# Patient Record
Sex: Male | Born: 1955
Health system: Southern US, Community
[De-identification: ages and names within clinical notes are randomized; demographics above are authoritative.]

## PROBLEM LIST (undated history)

## (undated) DIAGNOSIS — E785 Hyperlipidemia, unspecified: Secondary | ICD-10-CM

## (undated) DIAGNOSIS — Z8669 Personal history of other diseases of the nervous system and sense organs: Secondary | ICD-10-CM

## (undated) DIAGNOSIS — Z973 Presence of spectacles and contact lenses: Secondary | ICD-10-CM

## (undated) DIAGNOSIS — K219 Gastro-esophageal reflux disease without esophagitis: Secondary | ICD-10-CM

## (undated) DIAGNOSIS — K635 Polyp of colon: Secondary | ICD-10-CM

## (undated) DIAGNOSIS — T7840XA Allergy, unspecified, initial encounter: Secondary | ICD-10-CM

## (undated) DIAGNOSIS — N429 Disorder of prostate, unspecified: Secondary | ICD-10-CM

## (undated) HISTORY — DX: Disorder of prostate, unspecified: N42.9

## (undated) HISTORY — DX: Presence of spectacles and contact lenses: Z97.3

## (undated) HISTORY — DX: Gastro-esophageal reflux disease without esophagitis: K21.9

## (undated) HISTORY — DX: Allergy, unspecified, initial encounter: T78.40XA

## (undated) HISTORY — DX: Personal history of other diseases of the nervous system and sense organs: Z86.69

## (undated) HISTORY — PX: COSMETIC SURGERY: SHX468

## (undated) HISTORY — DX: Polyp of colon: K63.5

## (undated) HISTORY — DX: Hyperlipidemia, unspecified: E78.5

---
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
## 1977-10-12 HISTORY — PX: SCAR REVISION: SHX5285

## 1982-10-12 HISTORY — PX: HERNIA REPAIR: SHX51

## 1993-10-12 HISTORY — PX: OTHER SURGICAL HISTORY: SHX169

## 2011-03-17 ENCOUNTER — Encounter (INDEPENDENT_AMBULATORY_CARE_PROVIDER_SITE_OTHER): Payer: Self-pay | Admitting: General Surgery

## 2013-03-15 ENCOUNTER — Ambulatory Visit: Payer: Self-pay | Admitting: Family Medicine

## 2013-03-27 ENCOUNTER — Encounter: Payer: Self-pay | Admitting: Family Medicine

## 2013-03-27 ENCOUNTER — Ambulatory Visit (INDEPENDENT_AMBULATORY_CARE_PROVIDER_SITE_OTHER): Payer: 59 | Admitting: Family Medicine

## 2013-03-27 VITALS — BP 120/78 | HR 72 | Temp 98.4°F | Resp 12 | Ht 71.5 in | Wt 202.0 lb

## 2013-03-27 DIAGNOSIS — Z87898 Personal history of other specified conditions: Secondary | ICD-10-CM

## 2013-03-27 DIAGNOSIS — K219 Gastro-esophageal reflux disease without esophagitis: Secondary | ICD-10-CM | POA: Insufficient documentation

## 2013-03-27 DIAGNOSIS — Z87442 Personal history of urinary calculi: Secondary | ICD-10-CM

## 2013-03-27 DIAGNOSIS — Z8669 Personal history of other diseases of the nervous system and sense organs: Secondary | ICD-10-CM

## 2013-03-27 DIAGNOSIS — E785 Hyperlipidemia, unspecified: Secondary | ICD-10-CM

## 2013-03-27 MED ORDER — RANITIDINE HCL 150 MG PO TABS: 150.0000 mg | ORAL_TABLET | Freq: Every day | ORAL | Status: DC

## 2013-03-27 NOTE — Progress Notes (Signed)
  Subjective:    Patient ID: Steven Pollard, male    DOB: [birth year], 57 y.o.   MRN: [id number]  HPI Here to establish care Past medical history reviewed and he has history of GERD which is controlled with ranitidine. Prior history of 1 kidney stone 2005. Patient relates head injury 1979 and subsequent seizure. Was taken off Dilantin mid 1990s with no recurrent seizures since then.  History of dyslipidemia treated with Crestor and low vase. No cardiac history. No peripheral vascular disease. Quit smoking several years ago.  Multiple surgeries as outlined elsewhere.  Patient is married. Wife is a Development worker, community. Quit smoking around 2007. Occasional alcohol use. Reported colonoscopy 2009. Tetanus 2012.  Past Medical History  Diagnosis Date  . Hyperlipemia   . GERD (gastroesophageal reflux disease)   . Colon polyps   . Prostate disease   . History of seizure disorder   . Wears glasses    Past Surgical History  Procedure Laterality Date  . Tonsillectomy  1962  . Scar revision  1979  . Perforated eardrum  1995  . Hernia repair  1984    left    reports that he has never smoked. He does not have any smokeless tobacco history on file. His alcohol and drug histories are not on file. family history includes Arthritis in his mother and Heart disease (age of onset: 64) in his father. Allergies  Allergen Reactions  . Latex   . Oxycodone-Acetaminophen   . Caffeine Rash      Review of Systems  Constitutional: Negative for fatigue and unexpected weight change.  HENT: Negative for trouble swallowing.   Eyes: Negative for visual disturbance.  Respiratory: Negative for cough, chest tightness and shortness of breath.   Cardiovascular: Negative for chest pain, palpitations and leg swelling.  Gastrointestinal: Negative for abdominal pain.  Neurological: Negative for dizziness, syncope, weakness, light-headedness and headaches.       Objective:   Physical Exam  Nursing note and vitals  reviewed. Constitutional: He appears well-developed and well-nourished.  HENT:  Mouth/Throat: Oropharynx is clear and moist.  Neck: Neck supple. No thyromegaly present.  Cardiovascular: Normal rate and regular rhythm.   Pulmonary/Chest: Effort normal and breath sounds normal. No respiratory distress. He has no wheezes. He has no rales.  Musculoskeletal: He exhibits no edema.          Assessment & Plan:  #1 dyslipidemia. Over one year since prior labs. Schedule complete physical and lab work then including fasting lipids #2 GERD which is well controlled with low-dose Zantac. Refills were given for Zantac #3 remote history of kidney stone #4 remote history of seizure and he has not had any off medication in approximately 20 years

## 2013-03-27 NOTE — Patient Instructions (Addendum)
Schedule complete physical at your convenience.

## 2013-04-05 ENCOUNTER — Other Ambulatory Visit (INDEPENDENT_AMBULATORY_CARE_PROVIDER_SITE_OTHER): Payer: 59

## 2013-04-05 DIAGNOSIS — Z0000 Encounter for general adult medical examination without abnormal findings: Secondary | ICD-10-CM

## 2013-04-05 LAB — CBC WITH DIFFERENTIAL/PLATELET
Basophils Absolute: 0 10*3/uL (ref 0.0–0.1)
Eosinophils Absolute: 0.2 10*3/uL (ref 0.0–0.7)
HCT: 48 % (ref 39.0–52.0)
Hemoglobin: 16.2 g/dL (ref 13.0–17.0)
Lymphs Abs: 2 10*3/uL (ref 0.7–4.0)
MCHC: 33.8 g/dL (ref 30.0–36.0)
MCV: 97.8 fl (ref 78.0–100.0)
Monocytes Absolute: 0.6 10*3/uL (ref 0.1–1.0)
Neutro Abs: 3 10*3/uL (ref 1.4–7.7)
RDW: 13.3 % (ref 11.5–14.6)

## 2013-04-05 LAB — BASIC METABOLIC PANEL
CO2: 31 mEq/L (ref 19–32)
Glucose, Bld: 110 mg/dL — ABNORMAL HIGH (ref 70–99)
Potassium: 5.3 mEq/L — ABNORMAL HIGH (ref 3.5–5.1)
Sodium: 141 mEq/L (ref 135–145)

## 2013-04-05 LAB — POCT URINALYSIS DIPSTICK
Leukocytes, UA: NEGATIVE
Nitrite, UA: NEGATIVE
Urobilinogen, UA: 0.2

## 2013-04-05 LAB — HEPATIC FUNCTION PANEL
AST: 18 U/L (ref 0–37)
Albumin: 4.2 g/dL (ref 3.5–5.2)

## 2013-04-05 LAB — LIPID PANEL: Cholesterol: 151 mg/dL (ref 0–200)

## 2013-04-05 LAB — TSH: TSH: 1.48 u[IU]/mL (ref 0.35–5.50)

## 2013-04-12 ENCOUNTER — Ambulatory Visit (INDEPENDENT_AMBULATORY_CARE_PROVIDER_SITE_OTHER): Payer: 59 | Admitting: Family Medicine

## 2013-04-12 ENCOUNTER — Encounter: Payer: Self-pay | Admitting: Family Medicine

## 2013-04-12 VITALS — BP 126/64 | HR 78 | Temp 98.2°F | Ht 72.0 in | Wt 203.0 lb

## 2013-04-12 DIAGNOSIS — Z0000 Encounter for general adult medical examination without abnormal findings: Secondary | ICD-10-CM

## 2013-04-12 MED ORDER — OMEGA-3-ACID ETHYL ESTERS 1 G PO CAPS: ORAL_CAPSULE | ORAL | Status: DC

## 2013-04-12 MED ORDER — ROSUVASTATIN CALCIUM 10 MG PO TABS: 10.0000 mg | ORAL_TABLET | Freq: Every day | ORAL | Status: DC

## 2013-04-12 NOTE — Progress Notes (Signed)
  Subjective:    Patient ID: Steven Pollard, male    DOB: 1956-08-05, 57 y.o.   MRN: [id number]  HPI Patient seen for complete physical. Past history of GERD, remote history of kidney stones, remote history of seizure, and dyslipidemia Patient takes aspirin 1 daily Crestor 10 mg daily, and over-the-counter Zantac as needed. He takes omega-3 supplement 4 g daily  No history of peripheral vascular disease or coronary disease. Father had coronary disease age 57.  Patient nonsmoker. Some mild recent weight gain. No consistent exercise. Colonoscopy estimated 3-4 years ago. Tetanus and other immunizations up to date.  Past Medical History  Diagnosis Date  . Hyperlipemia   . GERD (gastroesophageal reflux disease)   . Colon polyps   . Prostate disease   . History of seizure disorder   . Wears glasses    Past Surgical History  Procedure Laterality Date  . Tonsillectomy  1962  . Scar revision  1979  . Perforated eardrum  1995  . Hernia repair  1984    left    reports that he has never smoked. He does not have any smokeless tobacco history on file. His alcohol and drug histories are not on file. family history includes Arthritis in his mother and Heart disease (age of onset: 67) in his father. Allergies  Allergen Reactions  . Latex   . Oxycodone-Acetaminophen   . Caffeine Rash      Review of Systems  Constitutional: Negative for fever, activity change, appetite change, fatigue and unexpected weight change.  HENT: Negative for ear pain, congestion and trouble swallowing.   Eyes: Negative for pain and visual disturbance.  Respiratory: Negative for cough, shortness of breath and wheezing.   Cardiovascular: Negative for chest pain and palpitations.  Gastrointestinal: Negative for nausea, vomiting, abdominal pain, diarrhea, constipation, blood in stool, abdominal distention and rectal pain.  Genitourinary: Negative for dysuria, hematuria and testicular pain.  Musculoskeletal:  Negative for joint swelling and arthralgias.  Skin: Negative for rash.  Neurological: Negative for dizziness, syncope and headaches.  Hematological: Negative for adenopathy.  Psychiatric/Behavioral: Negative for confusion and dysphoric mood.       Objective:   Physical Exam  Constitutional: He is oriented to person, place, and time. He appears well-developed and well-nourished. No distress.  HENT:  Head: Normocephalic and atraumatic.  Left Ear: External ear normal.  Mouth/Throat: Oropharynx is clear and moist.  Right eardrum is dysmorphic from multiple prior surgeries  Eyes: Conjunctivae and EOM are normal. Pupils are equal, round, and reactive to light.  Neck: Normal range of motion. Neck supple. No thyromegaly present.  Cardiovascular: Normal rate, regular rhythm and normal heart sounds.   No murmur heard. Pulmonary/Chest: No respiratory distress. He has no wheezes. He has no rales.  Abdominal: Soft. Bowel sounds are normal. He exhibits no distension and no mass. There is no tenderness. There is no rebound and no guarding.  Genitourinary: Rectum normal and prostate normal.  Musculoskeletal: He exhibits no edema.  Lymphadenopathy:    He has no cervical adenopathy.  Neurological: He is alert and oriented to person, place, and time. He displays normal reflexes. No cranial nerve deficit.  Skin: No rash noted.  Psychiatric: He has a normal mood and affect.          Assessment & Plan:  Complete physical. Consider shingles vaccine next year. Continue yearly flu vaccine. Refill lovaza and Crestor for one year. He has prediabetes. Reduce white starches and high glycemic foods. Establish more consistent exercise.

## 2013-04-12 NOTE — Patient Instructions (Addendum)

## 2013-04-28 ENCOUNTER — Other Ambulatory Visit: Payer: Self-pay | Admitting: Family Medicine

## 2013-11-23 ENCOUNTER — Encounter: Payer: Self-pay | Admitting: Family Medicine

## 2013-11-23 ENCOUNTER — Ambulatory Visit (INDEPENDENT_AMBULATORY_CARE_PROVIDER_SITE_OTHER): Payer: 59 | Admitting: Family Medicine

## 2013-11-23 VITALS — BP 130/78 | HR 72 | Temp 98.2°F | Wt 202.0 lb

## 2013-11-23 DIAGNOSIS — M436 Torticollis: Secondary | ICD-10-CM

## 2013-11-23 MED ORDER — CYCLOBENZAPRINE HCL 10 MG PO TABS: 10.0000 mg | ORAL_TABLET | Freq: Three times a day (TID) | ORAL | Status: DC | PRN

## 2013-11-23 NOTE — Progress Notes (Signed)
Pre visit review using our clinic review tool, if applicable. No additional management support is needed unless otherwise documented below in the visit note. 

## 2013-11-23 NOTE — Progress Notes (Signed)
   Subjective:    Patient ID: Steven Pollard, male    DOB: 25-Dec-1955, 58 y.o.   MRN: [id number]  HPI Patient seen with possible torticollis. About one month ago he was in a crawl space working on a house and was in same position for a long period. He has had one month of symptoms of right-sided neck tightness. Symptoms are worse first thing in the morning and gradually improved. Not tried any heat. He's tried occasional Tylenol. He denies radiculopathy symptoms. Denies any fever or sore throat or any other infectious symptoms. No recent trauma.  Past Medical History  Diagnosis Date  . Hyperlipemia   . GERD (gastroesophageal reflux disease)   . Colon polyps   . Prostate disease   . History of seizure disorder   . Wears glasses    Past Surgical History  Procedure Laterality Date  . Tonsillectomy  1962  . Scar revision  1979  . Perforated eardrum  1995  . Hernia repair  1984    left    reports that he has never smoked. He does not have any smokeless tobacco history on file. His alcohol and drug histories are not on file. family history includes Arthritis in his mother; Heart disease (age of onset: 58) in his father. Allergies  Allergen Reactions  . Latex   . Oxycodone-Acetaminophen   . Caffeine Rash      Review of Systems  Constitutional: Negative for fever and chills.  HENT: Negative for sore throat.   Neurological: Negative for weakness and numbness.       Objective:   Physical Exam  Constitutional: He appears well-developed and well-nourished.  HENT:  Mouth/Throat: Oropharynx is clear and moist.  Neck: Neck supple.  Cardiovascular: Normal rate and regular rhythm.   Pulmonary/Chest: Effort normal and breath sounds normal. No respiratory distress. He has no wheezes. He has no rales.  Musculoskeletal:  Fairly good range of motion cervical spine. No spinal tenderness.  Lymphadenopathy:    He has no cervical adenopathy.          Assessment & Plan:  Mild  torticollis. No infectious origin. Try Flexeril 10 mg each bedtime. Try moist heat. Try over-the-counter anti-inflammatories. Consider physical therapy if no better one to 2 weeks

## 2013-11-23 NOTE — Patient Instructions (Signed)
Torticollis, Acute °You have suddenly (acutely) developed a twisted neck (torticollis). This is usually a self-limited condition. °CAUSES  °Acute torticollis may be caused by malposition, trauma or infection. Most commonly, acute torticollis is caused by sleeping in an awkward position. Torticollis may also be caused by the flexion, extension or twisting of the neck muscles beyond their normal position. Sometimes, the exact cause may not be known. °SYMPTOMS  °Usually, there is pain and limited movement of the neck. Your neck may twist to one side. °DIAGNOSIS  °The diagnosis is often made by physical examination. X-rays, CT scans or MRIs may be done if there is a history of trauma or concern of infection. °TREATMENT  °For a common, stiff neck that develops during sleep, treatment is focused on relaxing the contracted neck muscle. Medications (including shots) may be used to treat the problem. Most cases resolve in several days. Torticollis usually responds to conservative physical therapy. If left untreated, the shortened and spastic neck muscle can cause deformities in the face and neck. Rarely, surgery is required. °HOME CARE INSTRUCTIONS  °· Use over-the-counter and prescription medications as directed by your caregiver. °· Do stretching exercises and massage the neck as directed by your caregiver. °· Follow up with physical therapy if needed and as directed by your caregiver. °SEEK IMMEDIATE MEDICAL CARE IF:  °· You develop difficulty breathing or noisy breathing (stridor). °· You drool, develop trouble swallowing or have pain with swallowing. °· You develop numbness or weakness in the hands or feet. °· You have changes in speech or vision. °· You have problems with urination or bowel movements. °· You have difficulty walking. °· You have a fever. °· You have increased pain. °MAKE SURE YOU:  °· Understand these instructions. °· Will watch your condition. °· Will get help right away if you are not doing well or  get worse. °Document Released: 09/25/2000 Document Revised: 12/21/2011 Document Reviewed: 11/06/2009 °[employer]® Patient Information ©2014 [employer]. ° °

## 2014-04-10 ENCOUNTER — Other Ambulatory Visit: Payer: Self-pay | Admitting: Family Medicine

## 2014-09-10 ENCOUNTER — Ambulatory Visit (INDEPENDENT_AMBULATORY_CARE_PROVIDER_SITE_OTHER): Payer: 59 | Admitting: Family Medicine

## 2014-09-10 ENCOUNTER — Encounter: Payer: Self-pay | Admitting: Family Medicine

## 2014-09-10 VITALS — BP 120/80 | Temp 97.9°F | Wt 196.0 lb

## 2014-09-10 DIAGNOSIS — H66004 Acute suppurative otitis media without spontaneous rupture of ear drum, recurrent, right ear: Secondary | ICD-10-CM

## 2014-09-10 MED ORDER — AMOXICILLIN-POT CLAVULANATE 875-125 MG PO TABS: 1.0000 | ORAL_TABLET | Freq: Two times a day (BID) | ORAL | Status: DC

## 2014-09-10 NOTE — Progress Notes (Signed)
Pre visit review using our clinic review tool, if applicable. No additional management support is needed unless otherwise documented below in the visit note. 

## 2014-09-10 NOTE — Progress Notes (Signed)
  Garret Reddish, MD Phone: [phone]  Subjective:   Steven Pollard is a 58 y.o. year old very pleasant male patient who presents with the following:  R ear pain Head injury in 1979 with perforated ear drum at that time. Patch by Dr. Truman Hayward but needed multiple surgeries with intermittent infections. Started seeing Dr. Thornell Mule due to recurrence of perforation and has another graft with at least 2 surgeries.   Patient can usually tell when it is infected. Took amoxicillin x 3 days and used ciprodex drops for another 3 days. Been going on for 3-4 weeks, progressing when not on antibiotics. Not draining currently  (ear did have some purulent drainage previously) but current moderate dull ache at 4/10  ROS- no fevers/chills/nausea/vomiting. No headaches or blurry vision. Did have vertigo about 3 weeks ago with 1 episode.   Past Medical History- Patient Active Problem List   Diagnosis Date Noted  . GERD (gastroesophageal reflux disease) 03/27/2013  . History of kidney stones 03/27/2013  . History of seizure 03/27/2013  . Dyslipidemia 03/27/2013   Medications- reviewed and updated Current Outpatient Prescriptions  Medication Sig Dispense Refill  . aspirin 81 MG tablet Take 81 mg by mouth daily.      . cyclobenzaprine (FLEXERIL) 10 MG tablet Take 1 tablet (10 mg total) by mouth 3 (three) times daily as needed for muscle spasms. 30 tablet 1  . omega-3 acid ethyl esters (LOVAZA) 1 G capsule Take 4 capsules daily 360 capsule 3  . ranitidine (ZANTAC) 150 MG tablet TAKE 1 TABLET BY MOUTH DAILY. 90 tablet 2  . rosuvastatin (CRESTOR) 10 MG tablet Take 1 tablet (10 mg total) by mouth daily. 90 tablet 3   Objective: BP 120/80 mmHg  Temp(Src) 97.9 F (36.6 C) (Oral)  Wt 196 lb (88.905 kg) Gen: NAD, resting comfortably TM normal on left TM on right-purulent material behind an erythematous bulging TM membrane with tinge of blood on upper right CV: RRR no murmurs rubs or gallops Lungs: CTAB no  crackles, wheeze, rhonchi Skin: warm, dry, no rash   Assessment/Plan:  Acute Otitis Media Right Treat with Augmentin x 10 days. Usually sees ENT for this but could not see Dr. Thornell Mule until Dec 15th. States usually uses ear drops such as ciprodex as well. I do not see an obvious perforation or drainage of purulent material so we will start with Augmentin alone and consider ciprodex if symptoms persist. Has follow up with Dr.Burchette in mid December which will be reasonable time for recheck. Patient could also consider ENT follow up with complicated history and multiple perforations and surgical repair.   Return precautions advised.   Meds ordered this encounter  Medications  . amoxicillin-clavulanate (AUGMENTIN) 875-125 MG per tablet    Sig: Take 1 tablet by mouth 2 (two) times daily.    Dispense:  20 tablet    Refill:  0

## 2014-09-10 NOTE — Patient Instructions (Signed)
Otitis media  Treat with augmentin x 10 days  Follow up if symptoms worsen or fail to improve  Health Maintenance Due  Topic Date Due  . TETANUS/TDAP  12/12/1972  . COLONOSCOPY  12/13/2003  . ZOSTAVAX  12/12/2013  . INFLUENZA VACCINE  05/12/2014

## 2014-09-17 ENCOUNTER — Encounter: Payer: Self-pay | Admitting: Family Medicine

## 2014-09-17 MED ORDER — CIPROFLOXACIN-DEXAMETHASONE 0.3-0.1 % OT SUSP: 4.0000 [drp] | Freq: Two times a day (BID) | OTIC | Status: DC

## 2014-09-19 ENCOUNTER — Other Ambulatory Visit (INDEPENDENT_AMBULATORY_CARE_PROVIDER_SITE_OTHER): Payer: 59

## 2014-09-19 DIAGNOSIS — Z0000 Encounter for general adult medical examination without abnormal findings: Secondary | ICD-10-CM

## 2014-09-19 LAB — HEPATIC FUNCTION PANEL
ALK PHOS: 57 U/L (ref 39–117)
ALT: 23 U/L (ref 0–53)
AST: 23 U/L (ref 0–37)
Albumin: 4.2 g/dL (ref 3.5–5.2)
BILIRUBIN DIRECT: 0.2 mg/dL (ref 0.0–0.3)
Total Bilirubin: 1.2 mg/dL (ref 0.2–1.2)
Total Protein: 6.4 g/dL (ref 6.0–8.3)

## 2014-09-19 LAB — CBC WITH DIFFERENTIAL/PLATELET
BASOS ABS: 0 10*3/uL (ref 0.0–0.1)
BASOS PCT: 0.6 % (ref 0.0–3.0)
Eosinophils Absolute: 0.2 10*3/uL (ref 0.0–0.7)
Eosinophils Relative: 5 % (ref 0.0–5.0)
HCT: 46.4 % (ref 39.0–52.0)
Hemoglobin: 15.6 g/dL (ref 13.0–17.0)
LYMPHS PCT: 33.9 % (ref 12.0–46.0)
Lymphs Abs: 1.7 10*3/uL (ref 0.7–4.0)
MCHC: 33.5 g/dL (ref 30.0–36.0)
MCV: 95.9 fl (ref 78.0–100.0)
Monocytes Absolute: 0.4 10*3/uL (ref 0.1–1.0)
Monocytes Relative: 7.7 % (ref 3.0–12.0)
NEUTROS PCT: 52.8 % (ref 43.0–77.0)
Neutro Abs: 2.6 10*3/uL (ref 1.4–7.7)
Platelets: 168 10*3/uL (ref 150.0–400.0)
RBC: 4.84 Mil/uL (ref 4.22–5.81)
RDW: 13.4 % (ref 11.5–15.5)
WBC: 5 10*3/uL (ref 4.0–10.5)

## 2014-09-19 LAB — TSH: TSH: 1.35 u[IU]/mL (ref 0.35–4.50)

## 2014-09-19 LAB — LIPID PANEL
Cholesterol: 123 mg/dL (ref 0–200)
HDL: 33 mg/dL — ABNORMAL LOW (ref >39.00)
LDL Cholesterol: 78 mg/dL (ref 0–99)
NONHDL: 90
TRIGLYCERIDES: 58 mg/dL (ref 0.0–149.0)
Total CHOL/HDL Ratio: 4
VLDL: 11.6 mg/dL (ref 0.0–40.0)

## 2014-09-19 LAB — POCT URINALYSIS DIPSTICK
Blood, UA: NEGATIVE
GLUCOSE UA: NEGATIVE
KETONES UA: NEGATIVE
LEUKOCYTES UA: NEGATIVE
Nitrite, UA: NEGATIVE
PH UA: 5.5
Protein, UA: NEGATIVE
Spec Grav, UA: 1.015
Urobilinogen, UA: 0.2

## 2014-09-19 LAB — BASIC METABOLIC PANEL
BUN: 10 mg/dL (ref 6–23)
CALCIUM: 9.2 mg/dL (ref 8.4–10.5)
CHLORIDE: 102 meq/L (ref 96–112)
CO2: 30 meq/L (ref 19–32)
Creatinine, Ser: 0.9 mg/dL (ref 0.4–1.5)
GFR: 96.16 mL/min (ref >60.00)
Glucose, Bld: 95 mg/dL (ref 70–99)
Potassium: 4.4 mEq/L (ref 3.5–5.1)
Sodium: 139 mEq/L (ref 135–145)

## 2014-09-19 LAB — PSA: PSA: 0.96 ng/mL (ref 0.10–4.00)

## 2014-09-26 ENCOUNTER — Ambulatory Visit (INDEPENDENT_AMBULATORY_CARE_PROVIDER_SITE_OTHER): Payer: 59 | Admitting: Family Medicine

## 2014-09-26 ENCOUNTER — Encounter: Payer: Self-pay | Admitting: Family Medicine

## 2014-09-26 ENCOUNTER — Ambulatory Visit (INDEPENDENT_AMBULATORY_CARE_PROVIDER_SITE_OTHER): Payer: 59

## 2014-09-26 VITALS — BP 120/78 | HR 68 | Temp 97.6°F | Ht 71.0 in | Wt 193.0 lb

## 2014-09-26 DIAGNOSIS — Z0000 Encounter for general adult medical examination without abnormal findings: Secondary | ICD-10-CM

## 2014-09-26 DIAGNOSIS — Z23 Encounter for immunization: Secondary | ICD-10-CM

## 2014-09-26 MED ORDER — ROSUVASTATIN CALCIUM 10 MG PO TABS: 10.0000 mg | ORAL_TABLET | Freq: Every day | ORAL | Status: DC

## 2014-09-26 MED ORDER — RANITIDINE HCL 150 MG PO TABS: 150.0000 mg | ORAL_TABLET | Freq: Every day | ORAL | Status: DC

## 2014-09-26 MED ORDER — OMEGA-3-ACID ETHYL ESTERS 1 G PO CAPS: ORAL_CAPSULE | ORAL | Status: DC

## 2014-09-26 NOTE — Progress Notes (Signed)
   Subjective:    Patient ID: Steven Pollard, male    DOB: 20-Oct-1955, 58 y.o.   MRN: [id number]  HPI Patient seen for complete physical. He has history of hyperlipidemia and GERD. Recent suppurative right otitis which did clear with oral antibiotic and topical drops. No drainage at this time. Needs flu vaccine. No history of shingles vaccine undecided. Colonoscopy 2009 reportedly. No consistent exercise.  Past Medical History  Diagnosis Date  . Hyperlipemia   . GERD (gastroesophageal reflux disease)   . Colon polyps   . Prostate disease   . History of seizure disorder   . Wears glasses    Past Surgical History  Procedure Laterality Date  . Tonsillectomy  1962  . Scar revision  1979  . Perforated eardrum  1995  . Hernia repair  1984    left    reports that he has never smoked. He does not have any smokeless tobacco history on file. His alcohol and drug histories are not on file. family history includes Arthritis in his mother; Heart disease (age of onset: 70) in his father. Allergies  Allergen Reactions  . Latex   . Oxycodone-Acetaminophen   . Caffeine Rash      Review of Systems  Constitutional: Negative for fever, activity change, appetite change and fatigue.  HENT: Negative for congestion, ear pain and trouble swallowing.   Eyes: Negative for pain and visual disturbance.  Respiratory: Negative for cough, shortness of breath and wheezing.   Cardiovascular: Negative for chest pain and palpitations.  Gastrointestinal: Negative for nausea, vomiting, abdominal pain, diarrhea, constipation, blood in stool, abdominal distention and rectal pain.  Genitourinary: Negative for dysuria, hematuria and testicular pain.  Musculoskeletal: Negative for joint swelling and arthralgias.  Skin: Negative for rash.  Neurological: Negative for dizziness, syncope and headaches.  Hematological: Negative for adenopathy.  Psychiatric/Behavioral: Negative for confusion and dysphoric mood.      Objective:   Physical Exam  Constitutional: He is oriented to person, place, and time. He appears well-developed and well-nourished. No distress.  HENT:  Head: Normocephalic and atraumatic.  Left Ear: External ear normal.  Mouth/Throat: Oropharynx is clear and moist.  Distorted landmarks right eardrum. He has some chronic supperative changes  Eyes: Conjunctivae and EOM are normal. Pupils are equal, round, and reactive to light.  Neck: Normal range of motion. Neck supple. No thyromegaly present.  Cardiovascular: Normal rate, regular rhythm and normal heart sounds.   No murmur heard. Pulmonary/Chest: No respiratory distress. He has no wheezes. He has no rales.  Abdominal: Soft. Bowel sounds are normal. He exhibits no distension and no mass. There is no tenderness. There is no rebound and no guarding.  Musculoskeletal: He exhibits no edema.  Lymphadenopathy:    He has no cervical adenopathy.  Neurological: He is alert and oriented to person, place, and time. He displays normal reflexes. No cranial nerve deficit.  Skin: No rash noted.  Psychiatric: He has a normal mood and affect.          Assessment & Plan:  Complete physical. Labs reviewed with no major concerns. Flu vaccine given. Consider shingles vaccine. Confirm date of last colonoscopy and date of last tetanus.

## 2014-09-26 NOTE — Patient Instructions (Signed)
Let us know if you are interested in getting the shingles vaccine

## 2014-09-26 NOTE — Progress Notes (Signed)
Pre visit review using our clinic review tool, if applicable. No additional management support is needed unless otherwise documented below in the visit note. 

## 2015-09-20 ENCOUNTER — Other Ambulatory Visit: Payer: Self-pay | Admitting: Family Medicine

## 2015-09-25 ENCOUNTER — Other Ambulatory Visit (INDEPENDENT_AMBULATORY_CARE_PROVIDER_SITE_OTHER): Payer: Commercial Managed Care - HMO

## 2015-09-25 DIAGNOSIS — Z0000 Encounter for general adult medical examination without abnormal findings: Secondary | ICD-10-CM | POA: Diagnosis not present

## 2015-09-25 LAB — BASIC METABOLIC PANEL
BUN: 10 mg/dL (ref 6–23)
CALCIUM: 9.8 mg/dL (ref 8.4–10.5)
CO2: 32 meq/L (ref 19–32)
CREATININE: 0.96 mg/dL (ref 0.40–1.50)
Chloride: 101 mEq/L (ref 96–112)
GFR: 84.42 mL/min (ref >60.00)
Glucose, Bld: 107 mg/dL — ABNORMAL HIGH (ref 70–99)
Potassium: 4 mEq/L (ref 3.5–5.1)
Sodium: 141 mEq/L (ref 135–145)

## 2015-09-25 LAB — HEPATIC FUNCTION PANEL
ALK PHOS: 63 U/L (ref 39–117)
ALT: 22 U/L (ref 0–53)
AST: 18 U/L (ref 0–37)
Albumin: 4.7 g/dL (ref 3.5–5.2)
BILIRUBIN DIRECT: 0.3 mg/dL (ref 0.0–0.3)
BILIRUBIN TOTAL: 1.1 mg/dL (ref 0.2–1.2)
TOTAL PROTEIN: 6.9 g/dL (ref 6.0–8.3)

## 2015-09-25 LAB — CBC WITH DIFFERENTIAL/PLATELET
BASOS PCT: 0.6 % (ref 0.0–3.0)
Basophils Absolute: 0 10*3/uL (ref 0.0–0.1)
EOS PCT: 2.5 % (ref 0.0–5.0)
Eosinophils Absolute: 0.1 10*3/uL (ref 0.0–0.7)
HEMATOCRIT: 47.6 % (ref 39.0–52.0)
HEMOGLOBIN: 16.1 g/dL (ref 13.0–17.0)
LYMPHS PCT: 31.7 % (ref 12.0–46.0)
Lymphs Abs: 1.8 10*3/uL (ref 0.7–4.0)
MCHC: 33.9 g/dL (ref 30.0–36.0)
MCV: 95.5 fl (ref 78.0–100.0)
MONO ABS: 0.6 10*3/uL (ref 0.1–1.0)
Monocytes Relative: 10.2 % (ref 3.0–12.0)
Neutro Abs: 3.2 10*3/uL (ref 1.4–7.7)
Neutrophils Relative %: 55 % (ref 43.0–77.0)
Platelets: 180 10*3/uL (ref 150.0–400.0)
RBC: 4.99 Mil/uL (ref 4.22–5.81)
RDW: 13.3 % (ref 11.5–15.5)
WBC: 5.8 10*3/uL (ref 4.0–10.5)

## 2015-09-25 LAB — LIPID PANEL
CHOL/HDL RATIO: 3
Cholesterol: 131 mg/dL (ref 0–200)
HDL: 39.9 mg/dL (ref >39.00)
LDL CALC: 74 mg/dL (ref 0–99)
NONHDL: 91.2
Triglycerides: 84 mg/dL (ref 0.0–149.0)
VLDL: 16.8 mg/dL (ref 0.0–40.0)

## 2015-09-25 LAB — TSH: TSH: 1.53 u[IU]/mL (ref 0.35–4.50)

## 2015-09-25 LAB — PSA: PSA: 1.52 ng/mL (ref 0.10–4.00)

## 2015-09-30 ENCOUNTER — Encounter: Payer: Self-pay | Admitting: Family Medicine

## 2015-10-02 ENCOUNTER — Encounter: Payer: Self-pay | Admitting: Family Medicine

## 2015-10-02 ENCOUNTER — Ambulatory Visit (INDEPENDENT_AMBULATORY_CARE_PROVIDER_SITE_OTHER): Payer: Commercial Managed Care - HMO | Admitting: Family Medicine

## 2015-10-02 VITALS — BP 118/82 | HR 95 | Temp 97.5°F | Resp 14 | Ht 71.0 in | Wt 197.1 lb

## 2015-10-02 DIAGNOSIS — Z0000 Encounter for general adult medical examination without abnormal findings: Secondary | ICD-10-CM

## 2015-10-02 DIAGNOSIS — Z23 Encounter for immunization: Secondary | ICD-10-CM | POA: Diagnosis not present

## 2015-10-02 DIAGNOSIS — R972 Elevated prostate specific antigen [PSA]: Secondary | ICD-10-CM

## 2015-10-02 DIAGNOSIS — R739 Hyperglycemia, unspecified: Secondary | ICD-10-CM | POA: Diagnosis not present

## 2015-10-02 NOTE — Progress Notes (Signed)
Pre visit review using our clinic review tool, if applicable. No additional management support is needed unless otherwise documented below in the visit note. 

## 2015-10-02 NOTE — Progress Notes (Signed)
Subjective:    Patient ID: Steven Pollard, male    DOB: 09-Nov-1955, 59 y.o.   MRN: [id number]  HPI Patient here for complete physical History of dyslipidemia treated with Crestor and Lovaza Also takes baby aspirin one daily.  He has past history of kidney stones and remote history of seizure. None recently. Never smoked. Has been retired for several years. No consistent exercise. No history of shingles vaccine. Still needs flu vaccine. He is fairly certain he had colonoscopy about 5 years ago and he will confirm date  Past Medical History  Diagnosis Date  . Hyperlipemia   . GERD (gastroesophageal reflux disease)   . Colon polyps   . Prostate disease   . History of seizure disorder   . Wears glasses    Past Surgical History  Procedure Laterality Date  . Tonsillectomy  1962  . Scar revision  1979  . Perforated eardrum  1995  . Hernia repair  1984    left    reports that he has never smoked. He does not have any smokeless tobacco history on file. His alcohol and drug histories are not on file. family history includes Arthritis in his mother; Heart disease (age of onset: 8) in his father. Allergies  Allergen Reactions  . Latex   . Oxycodone-Acetaminophen   . Caffeine Rash       Review of Systems  Constitutional: Negative for fever, chills, activity change, appetite change, fatigue and unexpected weight change.  HENT: Negative for congestion, ear pain and trouble swallowing.   Eyes: Negative for pain and visual disturbance.  Respiratory: Negative for cough, shortness of breath and wheezing.   Cardiovascular: Negative for chest pain and palpitations.  Gastrointestinal: Negative for nausea, vomiting, abdominal pain, diarrhea, constipation, blood in stool, abdominal distention and rectal pain.  Endocrine: Negative for polydipsia and polyuria.  Genitourinary: Negative for dysuria, hematuria and testicular pain.  Musculoskeletal: Negative for joint swelling and  arthralgias.  Skin: Negative for rash.  Neurological: Negative for dizziness, syncope and headaches.  Hematological: Negative for adenopathy.  Psychiatric/Behavioral: Negative for confusion and dysphoric mood.       Objective:   Physical Exam  Constitutional: He is oriented to person, place, and time. He appears well-developed and well-nourished. No distress.  HENT:  Head: Normocephalic and atraumatic.  Left Ear: External ear normal.  Mouth/Throat: Oropharynx is clear and moist.  He has chronic scarring and distortion right eardrum with serous effusion  Eyes: Conjunctivae and EOM are normal. Pupils are equal, round, and reactive to light.  Neck: Normal range of motion. Neck supple. No thyromegaly present.  Cardiovascular: Normal rate, regular rhythm and normal heart sounds.   No murmur heard. Pulmonary/Chest: No respiratory distress. He has no wheezes. He has no rales.  Abdominal: Soft. Bowel sounds are normal. He exhibits no distension and no mass. There is no tenderness. There is no rebound and no guarding.  Musculoskeletal: He exhibits no edema.  Lymphadenopathy:    He has no cervical adenopathy.  Neurological: He is alert and oriented to person, place, and time. He displays normal reflexes. No cranial nerve deficit.  Skin: No rash noted.  Psychiatric: He has a normal mood and affect.          Assessment & Plan:  Physical exam. Labs reviewed. PSA increased from 0.96-1.52. History of increased PSA velocity in the past and has seen urologist in the past. He agrees with repeat PSA in 6 months. Blood sugar mildly elevated today. Check A1c  then. Establish more consistent exercise. Flu vaccine and shingles vaccine given after discussion of risk and benefits.

## 2016-04-01 ENCOUNTER — Other Ambulatory Visit: Payer: Commercial Managed Care - HMO

## 2016-05-13 ENCOUNTER — Encounter: Payer: Self-pay | Admitting: Family Medicine

## 2016-05-13 ENCOUNTER — Ambulatory Visit (INDEPENDENT_AMBULATORY_CARE_PROVIDER_SITE_OTHER): Payer: Commercial Managed Care - HMO | Admitting: Family Medicine

## 2016-05-13 VITALS — BP 120/86 | HR 78 | Temp 97.8°F | Ht 71.0 in | Wt 201.0 lb

## 2016-05-13 DIAGNOSIS — Z129 Encounter for screening for malignant neoplasm, site unspecified: Secondary | ICD-10-CM

## 2016-05-13 DIAGNOSIS — R630 Anorexia: Secondary | ICD-10-CM | POA: Diagnosis not present

## 2016-05-13 DIAGNOSIS — H66001 Acute suppurative otitis media without spontaneous rupture of ear drum, right ear: Secondary | ICD-10-CM

## 2016-05-13 DIAGNOSIS — R972 Elevated prostate specific antigen [PSA]: Secondary | ICD-10-CM

## 2016-05-13 DIAGNOSIS — R739 Hyperglycemia, unspecified: Secondary | ICD-10-CM | POA: Diagnosis not present

## 2016-05-13 DIAGNOSIS — F411 Generalized anxiety disorder: Secondary | ICD-10-CM

## 2016-05-13 DIAGNOSIS — F5102 Adjustment insomnia: Secondary | ICD-10-CM

## 2016-05-13 LAB — CBC WITH DIFFERENTIAL/PLATELET
BASOS PCT: 0.3 % (ref 0.0–3.0)
Basophils Absolute: 0 10*3/uL (ref 0.0–0.1)
EOS PCT: 3.9 % (ref 0.0–5.0)
Eosinophils Absolute: 0.3 10*3/uL (ref 0.0–0.7)
HCT: 45.4 % (ref 39.0–52.0)
Hemoglobin: 15.6 g/dL (ref 13.0–17.0)
LYMPHS ABS: 2.1 10*3/uL (ref 0.7–4.0)
Lymphocytes Relative: 30.1 % (ref 12.0–46.0)
MCHC: 34.3 g/dL (ref 30.0–36.0)
MCV: 95.3 fl (ref 78.0–100.0)
MONOS PCT: 10.7 % (ref 3.0–12.0)
Monocytes Absolute: 0.7 10*3/uL (ref 0.1–1.0)
NEUTROS ABS: 3.8 10*3/uL (ref 1.4–7.7)
NEUTROS PCT: 55 % (ref 43.0–77.0)
PLATELETS: 185 10*3/uL (ref 150.0–400.0)
RBC: 4.76 Mil/uL (ref 4.22–5.81)
RDW: 13.6 % (ref 11.5–15.5)
WBC: 6.9 10*3/uL (ref 4.0–10.5)

## 2016-05-13 LAB — HEMOGLOBIN A1C: Hgb A1c MFr Bld: 5.5 % (ref 4.6–6.5)

## 2016-05-13 LAB — COMPREHENSIVE METABOLIC PANEL
ALT: 21 U/L (ref 0–53)
AST: 16 U/L (ref 0–37)
Albumin: 4.7 g/dL (ref 3.5–5.2)
Alkaline Phosphatase: 63 U/L (ref 39–117)
BUN: 12 mg/dL (ref 6–23)
CALCIUM: 9.9 mg/dL (ref 8.4–10.5)
CHLORIDE: 103 meq/L (ref 96–112)
CO2: 31 meq/L (ref 19–32)
Creatinine, Ser: 1.08 mg/dL (ref 0.40–1.50)
GFR: 73.54 mL/min (ref >60.00)
Glucose, Bld: 100 mg/dL — ABNORMAL HIGH (ref 70–99)
Potassium: 4.8 mEq/L (ref 3.5–5.1)
Sodium: 141 mEq/L (ref 135–145)
Total Bilirubin: 1.5 mg/dL — ABNORMAL HIGH (ref 0.2–1.2)
Total Protein: 6.8 g/dL (ref 6.0–8.3)

## 2016-05-13 LAB — PSA: PSA: 3.46 ng/mL (ref 0.10–4.00)

## 2016-05-13 LAB — SEDIMENTATION RATE: Sed Rate: 2 mm/hr (ref 0–20)

## 2016-05-13 MED ORDER — AMOXICILLIN-POT CLAVULANATE 875-125 MG PO TABS: 1.0000 | ORAL_TABLET | Freq: Two times a day (BID) | ORAL | 0 refills | Status: DC

## 2016-05-13 NOTE — Progress Notes (Signed)
Subjective:     Patient ID: Steven Pollard, male   DOB: 1956/06/02, 60 y.o.   MRN: [id number]  HPI Patient is seen accompanied by his wife with several issues as follows  Last week, his wife was out of town and he became quite anxious. He's had significant insomnia for the past few nights. States he had a similar episode of anxiety and some depression that came on rather acutely back in 2005. No history of mania or bipolar. Mood has been somewhat down over the past week. Denies any specific stressors other than his wife being out of town. He did relate a recent dream where his mother (who passed away several years ago) was in his presence and he thinks that might have triggered some anxiety and depression symptoms.  He also has noted decreased appetite over the past week. His weight is actually up 4 pounds compared to physical back in December. His wife states that he has fear of cancer but does not tend to obsess about this chronically. He did have somewhat increased PSA velocity back in December and we had discussed possible 6 month repeat. Glucose 107 with no history of diabetes. We also discussed repeat A1c. He has had some polydipsia but no polyuria.  Patient also relates some right ear pain of one-day duration. Minimal drainage. No acute hearing loss. No headaches. No fevers or chills.  Patient quit smoking 10 years ago. Over 30 pack year history. He's had occasional dry cough but no hemoptysis. No documented weight loss. No dyspnea He is interested in low dose CT lung cancer screen.  Past Medical History:  Diagnosis Date  . Colon polyps   . GERD (gastroesophageal reflux disease)   . History of seizure disorder   . Hyperlipemia   . Prostate disease   . Wears glasses    Past Surgical History:  Procedure Laterality Date  . HERNIA REPAIR  1984   left  . perforated eardrum  1995  . [place]  . TONSILLECTOMY  1962    reports that he has never smoked. He does not have any  smokeless tobacco history on file. His alcohol and drug histories are not on file. family history includes Arthritis in his mother; Heart disease (age of onset: 53) in his father. Allergies  Allergen Reactions  . Latex   . Oxycodone-Acetaminophen   . Caffeine Rash     Review of Systems  Constitutional: Positive for appetite change and fatigue. Negative for chills and fever.  HENT: Positive for ear discharge and ear pain. Negative for hearing loss.   Eyes: Negative for visual disturbance.  Respiratory: Negative for cough and shortness of breath.   Cardiovascular: Negative for chest pain, palpitations and leg swelling.  Gastrointestinal: Negative for abdominal pain, constipation, diarrhea, nausea and vomiting.  Endocrine: Positive for polydipsia.  Genitourinary: Negative for dysuria and hematuria.  Musculoskeletal: Negative for arthralgias.  Skin: Negative for rash.  Neurological: Negative for dizziness, syncope, weakness and headaches.  Hematological: Negative for adenopathy.  Psychiatric/Behavioral: Positive for dysphoric mood and sleep disturbance. Negative for confusion and suicidal ideas. The patient is nervous/anxious.        Objective:   Physical Exam  Constitutional: He is oriented to person, place, and time. He appears well-developed and well-nourished. No distress.  HENT:  Left Ear: External ear normal.  Mouth/Throat: Oropharynx is clear and moist.  Patient is very distorted right eardrum. Erythematous with clear supperative changes. No obvious perforation.  Neck: Neck supple. No  thyromegaly present.  Cardiovascular: Normal rate and regular rhythm.  Exam reveals no gallop.   No murmur heard. Pulmonary/Chest: Effort normal and breath sounds normal. No respiratory distress. He has no wheezes. He has no rales.  Abdominal: Soft. Bowel sounds are normal. He exhibits no distension. There is no tenderness. There is no rebound and no guarding.  Musculoskeletal: He exhibits no  edema.  Lymphadenopathy:    He has no cervical adenopathy.  Neurological: He is alert and oriented to person, place, and time. No cranial nerve deficit.  Skin: No rash noted.  Psychiatric:  PH Q-9 score of 11       Assessment:     #1 acute right suppurative otitis media  #2 acute sleep disturbance of uncertain etiology  #3 depressed mood and anxiety of acute onset with no clear precipitating cause. PH Q-9 score as above (11)  #4 loss of appetite without documented loss of weight  #5 history of increased PSA velocity  #6 history of mildly elevated glucose    Plan:     -Check further labs with CBC, comprehensive metabolic panel, [id number], PSA, sedimentation rate -Start Augmentin 875 mg twice daily for 10 days and take with food -Discussed low-dose CT lung cancer screening given his past history of smoking. Even though he has had some recent one-week history of loss of appetite is basically asymptomatic otherwise and this would be for screening purposes -not diagnostic -Discussed possible short-term use of anti-anxiety medication and at this point we prefer to observe and reassess in one week. Follow-up sooner for any progressive anxiety or depression symptoms  Over 40 minutes spent with > 50% in direct contact with patient assessing his multiple issues above.  We discussed laboratory evaluation, discussed sleep hygiene, non-pharmacologic ways of dealing with anxiety, medication options, and follow up.  [name] MD [facility]

## 2016-05-14 ENCOUNTER — Telehealth: Payer: Self-pay | Admitting: Family Medicine

## 2016-05-14 ENCOUNTER — Other Ambulatory Visit: Payer: Self-pay | Admitting: Acute Care

## 2016-05-14 DIAGNOSIS — Z87891 Personal history of nicotine dependence: Secondary | ICD-10-CM

## 2016-05-14 NOTE — Telephone Encounter (Signed)
° °  Pt wife call to say husband has been taking benadrayl per Dr Elease Hashimoto request. She said the benadryl is not working

## 2016-05-15 ENCOUNTER — Telehealth: Payer: Self-pay | Admitting: Family Medicine

## 2016-05-15 DIAGNOSIS — R972 Elevated prostate specific antigen [PSA]: Secondary | ICD-10-CM

## 2016-05-15 NOTE — Telephone Encounter (Signed)
Patient Name: CELIO HENDRIKS  DOB: [birth year]    Initial Comment Caller states husband is anxious, not sleeping, saw MD Wednesday, benadryl not helping him sleep. Makes him groggy and can't drive.   Nurse Assessment  Nurse: Mallie Mussel, RN, Alveta Heimlich Date/Time Eilene Ghazi Time): 05/15/2016 11:22:59 AM  Confirm and document reason for call. If symptomatic, describe symptoms. You must click the next button to save text entered. ---Caller states that her husband has been dealing with anxiety since the week before last. Was seen by the doctor on Wednesday. Dr. Elease Hashimoto wanted to just watch and see for now. He had been taking Benadryl to help with sleeping ,but it isn't making him sleep. It does make him drowsy. She is wanting to know if he will go ahead and call something in for him. Denies difficulty breathing. Denies suicide and wanting to do harm to others.  Has the patient traveled out of the country within the last 30 days? ---No  Does the patient have any new or worsening symptoms? ---Yes  Will a triage be completed? ---Yes  Related visit to physician within the last 2 weeks? ---Yes  Does the PT have any chronic conditions? (i.e. diabetes, asthma, etc.) ---Yes  List chronic conditions. ---GERD,  Is this a behavioral health or substance abuse call? ---Yes  Are you having any thoughts or feelings of harming or killing yourself or someone else? ---No  Are you currently experiencing any physical discomfort that you think may be related to the use of alcohol or other drugs? (use substance abuse or alcohol abuse guidelines. These include withdrawal symptoms) ---No  Do you worry that you may be hearing or seeing things that others do not? ---No  Do you take medications for your condition(s)? ---No     Guidelines    Guideline Title Affirmed Question Affirmed Notes  Anxiety and Panic Attack Symptoms interfere with work or school    Final Disposition User   See Physician within [place], RN, Alveta Heimlich    Comments   He has had dizziness since Wednesday. He calms down some when she is talking with him.  Uses [facility] on [address] in [place]. [phone]. Allergic to Oxycodone  Advised her that I will forward her request for medication to help her husband with his anxiety and to help him sleep. He was just seen a couple days ago, and has an appointment to be seen next week. She does not feel he needs to be seen again this soon.   Referrals  GO TO FACILITY REFUSED   Disagree/Comply: Disagree  Disagree/Comply Reason: Disagree with instructions

## 2016-05-15 NOTE — Telephone Encounter (Signed)
Pt is refusing to come into office Wanting something called in for anxiety. Is an appointment needed?

## 2016-05-15 NOTE — Telephone Encounter (Signed)
See other note

## 2016-05-15 NOTE — Telephone Encounter (Signed)
Please Advise   Is there something else you would like me to call in?

## 2016-05-15 NOTE — Telephone Encounter (Signed)
Pt notified of results.  He is having severe anxiety symptoms and unable to sleep.  Recommend the following:  -set up Urology referral ([facility]) for elevating PSA -limited Lorazepam 0.5 mg one po q 8 hours prn severe anxiety #30 with no refills.

## 2016-05-16 NOTE — Telephone Encounter (Signed)
[facility] Night - Client [place] Patient Name: Steven Pollard DOB: [birth year] Initial Comment Caller says husband saw the Dr on Wed, he is anxious and not sleeping, the anxiety Rx did not get called in properly Nurse Assessment Nurse: Germain Osgood, RN, Opal Sidles Date/Time Eilene Ghazi Time): 05/16/2016 11:07:10 AM Confirm and document reason for call. If symptomatic, describe symptoms. You must click the next button to save text entered. ---Caller says husband saw the Dr on Wed, he is anxious and not sleeping, the anxiety Rx did not get called in properly. States spoke with MD yesterday, was advised Lorazepam was going to be ordered. No med to pharmacy. Takes Ranitidine , ASA, Crestor, Fish Oil. Anxiety is new over last weeks and half. [facility]ses [facility] [place]. Has the patient traveled out of the country within the last 30 days? ---No Does the patient have any new or worsening symptoms? ---Yes Will a triage be completed? ---Yes Related visit to physician within the last 2 weeks? ---Yes Does the PT have any chronic conditions? (i.e. diabetes, asthma, etc.) ---Yes List chronic conditions. ---High Cholesterol GERD Is this a behavioral health or substance abuse call? ---No Guidelines Guideline Title Affirmed Question Affirmed Notes Depression [1] Depression AND [2] worsening (e.g.,sleeping poorly, less able to do activities of daily living) Final Disposition User See Physician within 24 Hours Germain Osgood, RN, Opal Sidles Referrals GO TO FACILITY UNDECIDED GO TO [place] UNDECIDED Disagree/Comply: Comply

## 2016-05-18 MED ORDER — LORAZEPAM 0.5 MG PO TABS: 0.5000 mg | ORAL_TABLET | Freq: Three times a day (TID) | ORAL | 0 refills | Status: DC | PRN

## 2016-05-18 NOTE — Telephone Encounter (Signed)
Please send in Lorazepam 0.5 mg one po q 8 hours prn severe anxiety.  #30 with no refill.

## 2016-05-18 NOTE — Addendum Note (Signed)
Addended by: Elio Forget on: 05/18/2016 12:37 PM   Modules accepted: Orders

## 2016-05-18 NOTE — Telephone Encounter (Signed)
Patient has a pending appt tomorrow.

## 2016-05-18 NOTE — Telephone Encounter (Signed)
Medication called in and referral entered. Pt is aware of this.

## 2016-05-19 ENCOUNTER — Ambulatory Visit (INDEPENDENT_AMBULATORY_CARE_PROVIDER_SITE_OTHER): Payer: Commercial Managed Care - HMO | Admitting: Family Medicine

## 2016-05-19 ENCOUNTER — Encounter: Payer: Self-pay | Admitting: Family Medicine

## 2016-05-19 VITALS — BP 110/70 | HR 77 | Temp 98.0°F | Ht 71.0 in | Wt 204.1 lb

## 2016-05-19 DIAGNOSIS — F329 Major depressive disorder, single episode, unspecified: Secondary | ICD-10-CM | POA: Diagnosis not present

## 2016-05-19 DIAGNOSIS — R972 Elevated prostate specific antigen [PSA]: Secondary | ICD-10-CM | POA: Diagnosis not present

## 2016-05-19 DIAGNOSIS — F32A Depression, unspecified: Secondary | ICD-10-CM

## 2016-05-19 MED ORDER — SERTRALINE HCL 50 MG PO TABS: 50.0000 mg | ORAL_TABLET | Freq: Every day | ORAL | 11 refills | Status: DC

## 2016-05-19 NOTE — Progress Notes (Signed)
Pre visit review using our clinic review tool, if applicable. No additional management support is needed unless otherwise documented below in the visit note. 

## 2016-05-19 NOTE — Progress Notes (Signed)
Subjective:     Patient ID: Steven Pollard, male   DOB: [birth year], 60 y.o.   MRN: [id number]  HPI  Follow-up from recent visit. Patient presented with decreased appetite along with increased anxiety and depression symptoms. He has not lost any weight.  Lab work was significant for elevated PSA velocity but otherwise labs unremarkable.  Patient's had sleep disturbance and depressed mood which is progressing from last visit. Rather acute onset. We called in very limited lorazepam yesterday but he has not taken any. Feels anxious but more than anything depressed. No suicidal ideation. Low motivation  Recent PHQ-9 score of 11.  Past Medical History:  Diagnosis Date  . Colon polyps   . GERD (gastroesophageal reflux disease)   . History of seizure disorder   . Hyperlipemia   . Prostate disease   . Wears glasses    Past Surgical History:  Procedure Laterality Date  . HERNIA REPAIR  1984   left  . perforated eardrum  1995  . [place]  . TONSILLECTOMY  1962    reports that he has never smoked. He does not have any smokeless tobacco history on file. His alcohol and drug histories are not on file. family history includes Arthritis in his mother; Heart disease (age of onset: 19) in his father. Allergies  Allergen Reactions  . Latex   . Oxycodone-Acetaminophen   . Caffeine Rash    Review of Systems  Constitutional: Positive for appetite change and fatigue. Negative for unexpected weight change.  Respiratory: Negative for shortness of breath.   Cardiovascular: Negative for chest pain.  Neurological: Negative for headaches.  Psychiatric/Behavioral: Positive for dysphoric mood and sleep disturbance. Negative for agitation, confusion and suicidal ideas. The patient is nervous/anxious.        Objective:   Physical Exam  Constitutional: He is oriented to person, place, and time. He appears well-developed and well-nourished.  HENT:  superative changes of right TM persist.   Neck: Neck supple. No thyromegaly present.  Cardiovascular: Normal rate and regular rhythm.   Pulmonary/Chest: Effort normal and breath sounds normal. No respiratory distress. He has no wheezes. He has no rales.  Musculoskeletal: He exhibits no edema.  Neurological: He is alert and oriented to person, place, and time. No cranial nerve deficit.       Assessment:     #1 depression-symptoms progressive over recent weeks  #2 Increased PSA velocity    Plan:     -Start Sertraline 50 mg once daily -Reassess in 3 weeks time -Patient has been referred to urology  [name] MD [facility] at [facility]

## 2016-05-20 ENCOUNTER — Ambulatory Visit: Payer: Commercial Managed Care - HMO | Admitting: Family Medicine

## 2016-05-28 ENCOUNTER — Encounter: Payer: Self-pay | Admitting: Family Medicine

## 2016-05-29 ENCOUNTER — Other Ambulatory Visit: Payer: Self-pay | Admitting: *Deleted

## 2016-05-29 MED ORDER — ZOLPIDEM TARTRATE 10 MG PO TABS: 10.0000 mg | ORAL_TABLET | Freq: Every evening | ORAL | 0 refills | Status: DC | PRN

## 2016-05-29 NOTE — Telephone Encounter (Signed)
Rx done and I called the pt and informed him of this. 

## 2016-06-04 ENCOUNTER — Encounter: Payer: Self-pay | Admitting: Acute Care

## 2016-06-04 ENCOUNTER — Ambulatory Visit (INDEPENDENT_AMBULATORY_CARE_PROVIDER_SITE_OTHER): Payer: Commercial Managed Care - HMO | Admitting: Acute Care

## 2016-06-04 ENCOUNTER — Ambulatory Visit
Admission: RE | Admit: 2016-06-04 | Discharge: 2016-06-04 | Disposition: A | Discharge status: Home / Self Care | Payer: Commercial Managed Care - HMO | Source: Ambulatory Visit | Attending: Acute Care | Admitting: Acute Care

## 2016-06-04 DIAGNOSIS — Z87891 Personal history of nicotine dependence: Secondary | ICD-10-CM | POA: Diagnosis not present

## 2016-06-04 NOTE — Progress Notes (Signed)
Shared Decision Making Visit Lung Cancer Screening Program [phone])   Eligibility:  Age 60 y.o.  Pack Years Smoking History Calculation 34 pack year smoking history (# packs/per year x # years smoked)  Recent History of coughing up blood  no  Unexplained weight loss? no ( >Than 15 pounds within the last 6 months )  Prior History Lung / other cancer no (Diagnosis within the last 5 years already requiring surveillance chest CT Scans).  Smoking Status Former Smoker  Former Smokers: Years since quit: 9 years  Quit Date: 2008  Visit Components:  Discussion included one or more decision making aids. yes  Discussion included risk/benefits of screening. yes  Discussion included potential follow up diagnostic testing for abnormal scans. yes  Discussion included meaning and risk of over diagnosis. yes  Discussion included meaning and risk of False Positives. yes  Discussion included meaning of total radiation exposure. yes  Counseling Included:  Importance of adherence to annual lung cancer LDCT screening. yes  Impact of comorbidities on ability to participate in the program. yes  Ability and willingness to under diagnostic treatment. yes  Smoking Cessation Counseling:  Current Smokers:   Discussed importance of smoking cessation. NA Former smoker  Information about tobacco cessation classes and interventions provided to patient. NA, former smoker  Patient provided with "ticket" for LDCT Scan. yes  Symptomatic Patient. no  Counseling  Diagnosis Code: Tobacco Use Z72.0  Asymptomatic Patient yes  Counseling NA Former smoker  Former Smokers:   Discussed the importance of maintaining cigarette abstinence. yes  Diagnosis Code: Personal History of Nicotine Dependence. [id number]  Information about tobacco cessation classes and interventions provided to patient. Yes  Patient provided with "ticket" for LDCT Scan. yes  Written Order for Lung Cancer Screening with  LDCT placed in Epic. Yes (CT Chest Lung Cancer Screening Low Dose W/O CM) [id number] Z12.2-Screening of respiratory organs Z87.891-Personal history of nicotine dependence  I spent 20 minutes of face to face time with Mr. Lari discussing the risks and benefits of lung cancer screening. We viewed a power point together that explained in detail the above noted topics. We took the time to pause the power point at intervals to allow for questions to be asked and answered to ensure understanding. We discussed that he had taken the single most powerful action possible to decrease his risk of developing lung cancer when he quit smoking. I counseled him to remain smoke free, and to contact me if he ever had the desire to smoke again so that I can provide resources and tools to help support the effort to remain smoke free. We discussed the time and location of the scan, and that either [place] or I will call with the results within  24-48 hours of receiving them. He has my card and contact information in the event he needs to speak with me, in addition to a copy of the power point we reviewed as a resource. He verbalized understanding of all of the above and had no further questions upon leaving the office.    Magdalen Spatz, NP 06/04/2016

## 2016-06-09 ENCOUNTER — Ambulatory Visit (INDEPENDENT_AMBULATORY_CARE_PROVIDER_SITE_OTHER): Payer: Commercial Managed Care - HMO | Admitting: Family Medicine

## 2016-06-09 ENCOUNTER — Encounter: Payer: Self-pay | Admitting: Family Medicine

## 2016-06-09 VITALS — BP 120/80 | HR 96 | Temp 98.5°F | Ht 71.0 in | Wt 201.6 lb

## 2016-06-09 DIAGNOSIS — E785 Hyperlipidemia, unspecified: Secondary | ICD-10-CM

## 2016-06-09 DIAGNOSIS — G4700 Insomnia, unspecified: Secondary | ICD-10-CM

## 2016-06-09 DIAGNOSIS — F321 Major depressive disorder, single episode, moderate: Secondary | ICD-10-CM | POA: Diagnosis not present

## 2016-06-09 MED ORDER — SERTRALINE HCL 100 MG PO TABS: 100.0000 mg | ORAL_TABLET | Freq: Every day | ORAL | 6 refills | Status: DC

## 2016-06-09 NOTE — Progress Notes (Signed)
Subjective:     Patient ID: Steven Pollard, male   DOB: [birth year], 60 y.o.   MRN: [id number]  HPI Patient here for follow-up regarding anxiety and depression symptoms. Refer to previous note. We started sertraline 50 mg daily. He feels his depression is improved but still has significant anxiety. He has had some progressive difficulty going in crowded places, especially where there is lots of noise. He had some issues with this apparently for years but progressive. He continues to have difficulty with sleep. We initially had him on low-dose lorazepam short-term but that did not seem to help. We called in some Ambien. He is able to fall asleep but frequently wakes up around 4 AM and can't get back to sleep and does not feel fully rested. He is not taking any daytime naps. Currently no alcohol use. No suicidal ideation  Recent CT lung cancer screen showed no pulmonary masses or nodules. He did have some calcification of the left anterior descending. No chest pains. No exertional dyspnea. No family history of premature CAD.  Past Medical History:  Diagnosis Date  . Colon polyps   . GERD (gastroesophageal reflux disease)   . History of seizure disorder   . Hyperlipemia   . Prostate disease   . Wears glasses    Past Surgical History:  Procedure Laterality Date  . HERNIA REPAIR  1984   left  . perforated eardrum  1995  . [place]  . TONSILLECTOMY  1962    reports that he quit smoking about 9 years ago. His smoking use included Cigarettes. He has a 34.00 pack-year smoking history. He has never used smokeless tobacco. His alcohol and drug histories are not on file. family history includes Arthritis in his mother; Heart disease (age of onset: 9) in his father. Allergies  Allergen Reactions  . Latex   . Oxycodone-Acetaminophen   . Caffeine Rash     Review of Systems  Constitutional: Negative for fatigue.  Eyes: Negative for visual disturbance.  Respiratory: Negative for cough,  chest tightness and shortness of breath.   Cardiovascular: Negative for chest pain, palpitations and leg swelling.  Gastrointestinal: Negative for abdominal pain.  Neurological: Negative for dizziness, syncope, weakness, light-headedness and headaches.  Psychiatric/Behavioral: Positive for dysphoric mood and sleep disturbance. Negative for agitation and suicidal ideas. The patient is nervous/anxious.        Objective:   Physical Exam  Constitutional: He is oriented to person, place, and time. He appears well-developed and well-nourished.  Cardiovascular: Normal rate and regular rhythm.   Pulmonary/Chest: Effort normal and breath sounds normal. No respiratory distress. He has no wheezes. He has no rales.  Musculoskeletal: He exhibits no edema.  Neurological: He is alert and oriented to person, place, and time. No cranial nerve deficit.  Psychiatric: He has a normal mood and affect. His behavior is normal.       Assessment:     #1 depression. Slightly improved. He still has significant anxiety symptoms  #2 acute insomnia  #3 nonspecific coronary artery calcification recent low-dose CT lung cancer screen    Plan:     -Titrate Zoloft to 100 mg daily -Continue Ambien at night as needed -Reassess in one month -We discussed present cons of cardiac stress testing and decided at this point not to pursue since he is not having any recent exertional symptoms and is fairly active physically -Continue aggressive risk factor reduction with statin and aspirin and good blood pressure control  Steven Pollard  Steven Rudge MD [facility] at [facility]

## 2016-06-09 NOTE — Progress Notes (Signed)
Pre visit review using our clinic review tool, if applicable. No additional management support is needed unless otherwise documented below in the visit note. 

## 2016-06-17 ENCOUNTER — Encounter: Payer: Self-pay | Admitting: Family Medicine

## 2016-06-19 ENCOUNTER — Other Ambulatory Visit: Payer: Self-pay

## 2016-06-19 MED ORDER — ESZOPICLONE 2 MG PO TABS: 2.0000 mg | ORAL_TABLET | Freq: Every evening | ORAL | 0 refills | Status: DC | PRN

## 2016-07-10 ENCOUNTER — Encounter: Payer: Self-pay | Admitting: Family Medicine

## 2016-07-10 ENCOUNTER — Ambulatory Visit (INDEPENDENT_AMBULATORY_CARE_PROVIDER_SITE_OTHER): Payer: Commercial Managed Care - HMO | Admitting: Family Medicine

## 2016-07-10 VITALS — BP 120/88 | HR 75 | Temp 97.9°F | Ht 71.0 in | Wt 199.9 lb

## 2016-07-10 DIAGNOSIS — G4700 Insomnia, unspecified: Secondary | ICD-10-CM

## 2016-07-10 DIAGNOSIS — Z23 Encounter for immunization: Secondary | ICD-10-CM | POA: Diagnosis not present

## 2016-07-10 DIAGNOSIS — F411 Generalized anxiety disorder: Secondary | ICD-10-CM

## 2016-07-10 MED ORDER — CIPROFLOXACIN-DEXAMETHASONE 0.3-0.1 % OT SUSP: 4.0000 [drp] | Freq: Two times a day (BID) | OTIC | 1 refills | Status: DC

## 2016-07-10 NOTE — Progress Notes (Signed)
Subjective:     Patient ID: Steven Pollard, male   DOB: 1956-03-08, 60 y.o.   MRN: [id number]  HPI Patient seen for follow-up regarding recent issues with insomnia, depression, and anxiety. We titrated his sertraline up 100 mg and he has seen definite improvements. He is much less anxious and also his mood is much improved. His wife has seen positive changes as well. He is having great difficulty sleeping and did not do well with several medications. He had previously tried Benadryl which did not seem to work well. We eventually prescribed Lunesta 2 mg which seemed to help. He has recently been taking half dose of Unisom which seems to be helping.  Past Medical History:  Diagnosis Date  . Colon polyps   . GERD (gastroesophageal reflux disease)   . History of seizure disorder   . Hyperlipemia   . Prostate disease   . Wears glasses    Past Surgical History:  Procedure Laterality Date  . HERNIA REPAIR  1984   left  . perforated eardrum  1995  . [place]  . TONSILLECTOMY  1962    reports that he quit smoking about 9 years ago. His smoking use included Cigarettes. He has a 34.00 pack-year smoking history. He has never used smokeless tobacco. His alcohol and drug histories are not on file. family history includes Arthritis in his mother; Heart disease (age of onset: 5) in his father. Allergies  Allergen Reactions  . Latex   . Oxycodone-Acetaminophen   . Caffeine Rash     Review of Systems  Constitutional: Negative for fatigue.  Respiratory: Negative for shortness of breath.   Psychiatric/Behavioral: Negative for dysphoric mood. The patient is not nervous/anxious.        Objective:   Physical Exam  Constitutional: He appears well-developed and well-nourished. No distress.  Cardiovascular: Normal rate and regular rhythm.   Pulmonary/Chest: Effort normal and breath sounds normal. No respiratory distress. He has no wheezes. He has no rales.  Musculoskeletal: He exhibits  no edema.       Assessment:     #1 transient insomnia improved  #2 anxiety state improved on sertraline    Plan:     -Flu vaccine given. -Recommend continue sertraline minimal of 6 months. We'll reassess at his physical which he will schedule for January  [name] MD [facility]

## 2016-07-10 NOTE — Patient Instructions (Signed)
Set up CPE for sometime in January.

## 2016-07-11 DIAGNOSIS — F411 Generalized anxiety disorder: Secondary | ICD-10-CM | POA: Insufficient documentation

## 2016-07-11 DIAGNOSIS — G4700 Insomnia, unspecified: Secondary | ICD-10-CM | POA: Insufficient documentation

## 2016-07-14 ENCOUNTER — Other Ambulatory Visit: Payer: Self-pay

## 2016-07-14 MED ORDER — SERTRALINE HCL 100 MG PO TABS: 100.0000 mg | ORAL_TABLET | Freq: Every day | ORAL | 1 refills | Status: DC

## 2016-07-29 ENCOUNTER — Telehealth: Payer: Self-pay | Admitting: Acute Care

## 2016-07-29 DIAGNOSIS — Z87891 Personal history of nicotine dependence: Secondary | ICD-10-CM

## 2016-07-29 NOTE — Telephone Encounter (Signed)
This is to document a phone call made 06/08/2016 to Mr. Steven Pollard. I called Steven Pollard with the results of his low-dose CT screening scan. Scan results were read as a Lung RADS 1, negative study: no nodules or definitely benign nodules. Radiology recommendation is for a repeat LDCT in 12 months. I told Steven Pollard that we would order and schedule his follow-up scan for August 2018. I also let him know that there were incidental findings of emphysema, and aortic atherosclerosis and LAD coronary artery calcification. He explained to me that he is currently taking a statin medication and has his cholesterol and triglycerides monitored carefully by his primary care physician. I told him that I would fax a copy of these results to his primary care physician for completeness of his medical care. He verbalized understanding and had no further questions at completion of the phone call. He has my contact information in the event he has any questions in the future

## 2016-09-09 ENCOUNTER — Other Ambulatory Visit: Payer: Self-pay | Admitting: Family Medicine

## 2016-09-25 ENCOUNTER — Encounter: Payer: Self-pay | Admitting: Family Medicine

## 2016-09-25 ENCOUNTER — Ambulatory Visit (INDEPENDENT_AMBULATORY_CARE_PROVIDER_SITE_OTHER): Payer: Commercial Managed Care - HMO | Admitting: Family Medicine

## 2016-09-25 VITALS — BP 110/76 | HR 83 | Temp 98.3°F | Ht 71.0 in | Wt 205.9 lb

## 2016-09-25 DIAGNOSIS — M5416 Radiculopathy, lumbar region: Secondary | ICD-10-CM | POA: Diagnosis not present

## 2016-09-25 MED ORDER — CELECOXIB 200 MG PO CAPS: 200.0000 mg | ORAL_CAPSULE | Freq: Every day | ORAL | 2 refills | Status: DC

## 2016-09-25 NOTE — Progress Notes (Signed)
Pre visit review using our clinic review tool, if applicable. No additional management support is needed unless otherwise documented below in the visit note. 

## 2016-09-25 NOTE — Patient Instructions (Signed)
Low Back Strain Rehab  Ask your health care provider which exercises are safe for you. Do exercises exactly as told by your health care provider and adjust them as directed. It is normal to feel mild stretching, pulling, tightness, or discomfort as you do these exercises, but you should stop right away if you feel sudden pain or your pain gets worse. Do not begin these exercises until told by your health care provider.  Stretching and range of motion exercises  These exercises warm up your muscles and joints and improve the movement and flexibility of your back. These exercises also help to relieve pain, numbness, and tingling.  Exercise A: Single knee to chest     1. Lie on your back on a firm surface with both legs straight.  2. Bend one of your knees. Use your hands to move your knee up toward your chest until you feel a gentle stretch in your lower back and buttock.  ? Hold your leg in this position by holding onto the front of your knee.  ? Keep your other leg as straight as possible.  3. Hold for __________ seconds.  4. Slowly return to the starting position.  5. Repeat with your other leg.  Repeat __________ times. Complete this exercise __________ times a day.  Exercise B: Prone extension on elbows     1. Lie on your abdomen on a firm surface.  2. Prop yourself up on your elbows.  3. Use your arms to help lift your chest up until you feel a gentle stretch in your abdomen and your lower back.  ? This will place some of your body weight on your elbows. If this is uncomfortable, try stacking pillows under your chest.  ? Your hips should stay down, against the surface that you are lying on. Keep your hip and back muscles relaxed.  4. Hold for __________ seconds.  5. Slowly relax your upper body and return to the starting position.  Repeat __________ times. Complete this exercise __________ times a day.  Strengthening exercises  These exercises build strength and endurance in your back. Endurance is the ability  to use your muscles for a long time, even after they get tired.  Exercise C: Pelvic tilt   1. Lie on your back on a firm surface. Bend your knees and keep your feet flat.  2. Tense your abdominal muscles. Tip your pelvis up toward the ceiling and flatten your lower back into the floor.  ? To help with this exercise, you may place a small towel under your lower back and try to push your back into the towel.  3. Hold for __________ seconds.  4. Let your muscles relax completely before you repeat this exercise.  Repeat __________ times. Complete this exercise __________ times a day.  Exercise D: Alternating arm and leg raises     1. Get on your hands and knees on a firm surface. If you are on a hard floor, you may want to use padding to cushion your knees, such as an exercise mat.  2. Line up your arms and legs. Your hands should be below your shoulders, and your knees should be below your hips.  3. Lift your left leg behind you. At the same time, raise your right arm and straighten it in front of you.  ? Do not lift your leg higher than your hip.  ? Do not lift your arm higher than your shoulder.  ? Keep your abdominal and back   muscles tight.  ? Keep your hips facing the ground.  ? Do not arch your back.  ? Keep your balance carefully, and do not hold your breath.  4. Hold for __________ seconds.  5. Slowly return to the starting position and repeat with your right leg and your left arm.  Repeat __________ times. Complete this exercise __________times a day.  Exercise J: Single leg lower with bent knees   1. Lie on your back on a firm surface.  2. Tense your abdominal muscles and lift your feet off the floor, one foot at a time, so your knees and hips are bent in an "L" shape (at about 90 degrees).  ? Your knees should be over your hips and your lower legs should be parallel to the floor.  3. Keeping your abdominal muscles tense and your knee bent, slowly lower one of your legs so your toe touches the ground.  4. Lift  your leg back up to return to the starting position.  ? Do not hold your breath.  ? Do not let your back arch. Keep your back flat against the ground.  5. Repeat with your other leg.  Repeat __________ times. Complete this exercise __________ times a day.  Posture and body mechanics     Body mechanics refers to the movements and positions of your body while you do your daily activities. Posture is part of body mechanics. Good posture and healthy body mechanics can help to relieve stress in your body's tissues and joints. Good posture means that your spine is in its natural S-curve position (your spine is neutral), your shoulders are pulled back slightly, and your head is not tipped forward. The following are general guidelines for applying improved posture and body mechanics to your everyday activities.  Standing     · When standing, keep your spine neutral and your feet about hip-width apart. Keep a slight bend in your knees. Your ears, shoulders, and hips should line up.  · When you do a task in which you stand in one place for a long time, place one foot up on a stable object that is 2-4 inches (5-10 cm) high, such as a footstool. This helps keep your spine neutral.  Sitting     · When sitting, keep your spine neutral and keep your feet flat on the floor. Use a footrest, if necessary, and keep your thighs parallel to the floor. Avoid rounding your shoulders, and avoid tilting your head forward.  · When working at a desk or a computer, keep your desk at a height where your hands are slightly lower than your elbows. Slide your chair under your desk so you are close enough to maintain good posture.  · When working at a computer, place your monitor at a height where you are looking straight ahead and you do not have to tilt your head forward or downward to look at the screen.  Resting     · When lying down and resting, avoid positions that are most painful for you.  · If you have pain with activities such as sitting,  bending, stooping, or squatting (flexion-based activities), lie in a position in which your body does not bend very much. For example, avoid curling up on your side with your arms and knees near your chest (fetal position).  · If you have pain with activities such as standing for a long time or reaching with your arms (extension-based activities), lie with your spine in a   neutral position and bend your knees slightly. Try the following positions:  ? Lying on your side with a pillow between your knees.  ? Lying on your back with a pillow under your knees.  Lifting     · When lifting objects, keep your feet at least shoulder-width apart and tighten your abdominal muscles.  · Bend your knees and hips and keep your spine neutral. It is important to lift using the strength of your legs, not your back. Do not lock your knees straight out.  · Always ask for help to lift heavy or awkward objects.  This information is not intended to replace advice given to you by your health care provider. Make sure you discuss any questions you have with your health care provider.  Document Released: 09/28/2005 Document Revised: 06/04/2016 Document Reviewed: 07/10/2015  Elsevier Interactive Patient Education © 2017 [employer]

## 2016-09-25 NOTE — Progress Notes (Signed)
Subjective:     Patient ID: Steven Pollard, male   DOB: 1956-06-06, 60 y.o.   MRN: [id number]  HPI Patient seen with 2-3 month history of pain radiating from his right lower lumbar area into the buttock and down occasionally all with the ankle. He describes mostly sharp pain which is worse with movement and usually worse first thing in the morning. 6 out of 10 severity at is worse. He has occasional tingling sensation right lateral leg. No weakness. No urine or stool incontinence. Pain is interfering with Steven Pollard. He's tried heat without relief. Tylenol with minimal relief. He took Robaxin without any change. He took Celebrex 200 mg which seemed to help his pain earlier this week. Denies any recent back injury. No prior back surgery.  Past Medical History:  Diagnosis Date  . Colon polyps   . GERD (gastroesophageal reflux disease)   . History of seizure disorder   . Hyperlipemia   . Prostate disease   . Wears glasses    Past Surgical History:  Procedure Laterality Date  . HERNIA REPAIR  1984   left  . perforated eardrum  1995  . [place]  . TONSILLECTOMY  1962    reports that he quit smoking about 9 years ago. His smoking use included Cigarettes. He has a 34.00 pack-year smoking history. He has never used smokeless tobacco. His alcohol and drug histories are not on file. family history includes Arthritis in his mother; Heart disease (age of onset: 60) in his father. Allergies  Allergen Reactions  . Latex   . Oxycodone-Acetaminophen   . Caffeine Rash     Review of Systems  Constitutional: Negative for activity change, appetite change and fever.  Respiratory: Negative for cough and shortness of breath.   Cardiovascular: Negative for chest pain and leg swelling.  Gastrointestinal: Negative for abdominal pain and vomiting.  Endocrine: Negative for polydipsia and polyuria.  Genitourinary: Negative for dysuria, flank pain and hematuria.  Musculoskeletal: Positive for back  pain. Negative for joint swelling.  Neurological: Negative for weakness and numbness.       Objective:   Physical Exam  Constitutional: He is oriented to person, place, and time. He appears well-developed and well-nourished. No distress.  Neck: No thyromegaly present.  Cardiovascular: Normal rate, regular rhythm and normal heart sounds.   No murmur heard. Pulmonary/Chest: Effort normal and breath sounds normal. No respiratory distress. He has no wheezes. He has no rales.  Musculoskeletal: He exhibits no edema.  Excellent range of motion right hip. No lateral hip tenderness. Straight leg raise are negative.  Neurological: He is alert and oriented to person, place, and time. He has normal reflexes. No cranial nerve deficit.  Full strength lower extremities. Knee and ankle reflexes are symmetric  Skin: No rash noted.       Assessment:     Right lumbar back pain with radiculitis features. Nonfocal neuro exam    Plan:     -Trial Celebrex 200 mg once daily -Recommend some simple extension stretches -Walking as tolerated -May supplement with Tylenol as needed -He has physical in early January and reassess at that point. Consider physical therapy if not improving by then  Steven Post MD [facility] at [facility]

## 2016-10-08 ENCOUNTER — Other Ambulatory Visit (INDEPENDENT_AMBULATORY_CARE_PROVIDER_SITE_OTHER): Payer: Commercial Managed Care - HMO

## 2016-10-08 DIAGNOSIS — Z0000 Encounter for general adult medical examination without abnormal findings: Secondary | ICD-10-CM

## 2016-10-08 LAB — BASIC METABOLIC PANEL
BUN: 11 mg/dL (ref 6–23)
CHLORIDE: 104 meq/L (ref 96–112)
CO2: 33 mEq/L — ABNORMAL HIGH (ref 19–32)
Calcium: 9.4 mg/dL (ref 8.4–10.5)
Creatinine, Ser: 0.94 mg/dL (ref 0.40–1.50)
GFR: 86.2 mL/min (ref >60.00)
GLUCOSE: 98 mg/dL (ref 70–99)
POTASSIUM: 3.9 meq/L (ref 3.5–5.1)
SODIUM: 142 meq/L (ref 135–145)

## 2016-10-08 LAB — CBC WITH DIFFERENTIAL/PLATELET
BASOS PCT: 0.6 % (ref 0.0–3.0)
Basophils Absolute: 0 10*3/uL (ref 0.0–0.1)
EOS PCT: 2.6 % (ref 0.0–5.0)
Eosinophils Absolute: 0.1 10*3/uL (ref 0.0–0.7)
HCT: 45.1 % (ref 39.0–52.0)
HEMOGLOBIN: 15.6 g/dL (ref 13.0–17.0)
LYMPHS ABS: 2 10*3/uL (ref 0.7–4.0)
Lymphocytes Relative: 38.1 % (ref 12.0–46.0)
MCHC: 34.6 g/dL (ref 30.0–36.0)
MCV: 96.7 fl (ref 78.0–100.0)
MONOS PCT: 9.9 % (ref 3.0–12.0)
Monocytes Absolute: 0.5 10*3/uL (ref 0.1–1.0)
Neutro Abs: 2.5 10*3/uL (ref 1.4–7.7)
Neutrophils Relative %: 48.8 % (ref 43.0–77.0)
Platelets: 173 10*3/uL (ref 150.0–400.0)
RBC: 4.66 Mil/uL (ref 4.22–5.81)
RDW: 13.2 % (ref 11.5–15.5)
WBC: 5.1 10*3/uL (ref 4.0–10.5)

## 2016-10-08 LAB — LIPID PANEL
CHOLESTEROL: 123 mg/dL (ref 0–200)
HDL: 40.7 mg/dL (ref >39.00)
LDL Cholesterol: 61 mg/dL (ref 0–99)
NonHDL: 82.21
Total CHOL/HDL Ratio: 3
Triglycerides: 104 mg/dL (ref 0.0–149.0)
VLDL: 20.8 mg/dL (ref 0.0–40.0)

## 2016-10-08 LAB — HEPATIC FUNCTION PANEL
ALBUMIN: 4.6 g/dL (ref 3.5–5.2)
ALT: 23 U/L (ref 0–53)
AST: 18 U/L (ref 0–37)
Alkaline Phosphatase: 63 U/L (ref 39–117)
Bilirubin, Direct: 0.2 mg/dL (ref 0.0–0.3)
Total Bilirubin: 0.8 mg/dL (ref 0.2–1.2)
Total Protein: 6.4 g/dL (ref 6.0–8.3)

## 2016-10-08 LAB — TSH: TSH: 1 u[IU]/mL (ref 0.35–4.50)

## 2016-10-08 LAB — PSA: PSA: 0.93 ng/mL (ref 0.10–4.00)

## 2016-10-12 HISTORY — PX: BACK SURGERY: SHX140

## 2016-10-13 ENCOUNTER — Ambulatory Visit (INDEPENDENT_AMBULATORY_CARE_PROVIDER_SITE_OTHER): Payer: Commercial Managed Care - HMO | Admitting: Family Medicine

## 2016-10-13 ENCOUNTER — Encounter: Payer: Self-pay | Admitting: Family Medicine

## 2016-10-13 VITALS — BP 118/70 | HR 70 | Temp 98.2°F | Ht 71.75 in | Wt 204.8 lb

## 2016-10-13 DIAGNOSIS — M5416 Radiculopathy, lumbar region: Secondary | ICD-10-CM | POA: Diagnosis not present

## 2016-10-13 DIAGNOSIS — Z0000 Encounter for general adult medical examination without abnormal findings: Secondary | ICD-10-CM

## 2016-10-13 DIAGNOSIS — Z0001 Encounter for general adult medical examination with abnormal findings: Secondary | ICD-10-CM | POA: Diagnosis not present

## 2016-10-13 NOTE — Progress Notes (Signed)
Pre visit review using our clinic review tool, if applicable. No additional management support is needed unless otherwise documented below in the visit note. 

## 2016-10-13 NOTE — Patient Instructions (Signed)
We will set up MRI of lumbar spine Try reducing the Sertraline to 50 mg once daily to see if this helps libido.

## 2016-10-13 NOTE — Progress Notes (Addendum)
Subjective:     Patient ID: Steven Pollard, male   DOB: [birth year], 62 y.o.   MRN: [id number]  HPI Patient seen for complete physical. Thinks he had a colonoscopy in 2011. Shingles vaccine 2 years ago. Tetanus up-to-date. Nonsmoker. No regular alcohol use. History of dyslipidemia treated with Crestor and Lovaza. No cardiac history.  Patient has some depression and anxiety symptoms last fall we started sertraline and he is much improved. Unfortunately though he has noted some decreased libido.  No suicidal ideation  Patient had climbing PSA but will most recent labs this came down from 3.5 to 0.9 range  He has persistent right low back pain and right lower extremity radiculopathy symptoms. He describes a tingling sensation frequently in his leg but no weakness. No loss of urine or stool control. His pain has progressed over several months. Occasionally 8 out of 10 severity but usually less. He's tried heat without much improvement. He's tried Celebrex and over-the-counter anti-inflammatories without improvement. His wife had some Robaxin which did not help. No prior history of lumbar disc problems. Appetite and weight are stable.  Symptoms are exacerbated with straining the right lower extremity wound and improved somewhat when he crosses his leg. Denies any anterior hip pain.  His pain radiates from the right lower lumbar region occasionally down below the knee  Past Medical History:  Diagnosis Date  . Colon polyps   . GERD (gastroesophageal reflux disease)   . History of seizure disorder   . Hyperlipemia   . Prostate disease   . Wears glasses    Past Surgical History:  Procedure Laterality Date  . HERNIA REPAIR  1984   left  . perforated eardrum  1995  . [place]  . TONSILLECTOMY  1962    reports that he quit smoking about 10 years ago. His smoking use included Cigarettes. He has a 34.00 pack-year smoking history. He has never used smokeless tobacco. His alcohol and drug  histories are not on file. family history includes Arthritis in his mother; Heart disease (age of onset: 48) in his father. Allergies  Allergen Reactions  . Latex   . Oxycodone-Acetaminophen   . Caffeine Rash     Review of Systems  Constitutional: Negative for activity change, appetite change, fatigue and fever.  HENT: Negative for congestion, ear pain and trouble swallowing.   Eyes: Negative for pain and visual disturbance.  Respiratory: Negative for cough, shortness of breath and wheezing.   Cardiovascular: Negative for chest pain and palpitations.  Gastrointestinal: Negative for abdominal distention, abdominal pain, blood in stool, constipation, diarrhea, nausea, rectal pain and vomiting.  Genitourinary: Negative for dysuria, hematuria and testicular pain.  Musculoskeletal: Positive for back pain. Negative for arthralgias and joint swelling.  Skin: Negative for rash.  Neurological: Negative for dizziness, syncope and headaches.  Hematological: Negative for adenopathy.  Psychiatric/Behavioral: Negative for confusion and dysphoric mood.       Objective:   Physical Exam  Constitutional: He is oriented to person, place, and time. He appears well-developed and well-nourished. No distress.  HENT:  Head: Normocephalic and atraumatic.  Right Ear: External ear normal.  Left Ear: External ear normal.  Mouth/Throat: Oropharynx is clear and moist.  Eyes: Conjunctivae and EOM are normal. Pupils are equal, round, and reactive to light.  Neck: Normal range of motion. Neck supple. No thyromegaly present.  Cardiovascular: Normal rate, regular rhythm and normal heart sounds.   No murmur heard. Pulmonary/Chest: No respiratory distress. He has no wheezes.  He has no rales.  Abdominal: Soft. Bowel sounds are normal. He exhibits no distension and no mass. There is no tenderness. There is no rebound and no guarding.  Musculoskeletal: He exhibits no edema.  Excellent range of motion right hip.  Straight leg raise is negative  Lymphadenopathy:    He has no cervical adenopathy.  Neurological: He is alert and oriented to person, place, and time. He displays normal reflexes. No cranial nerve deficit.  Full strength lower extremities. Symmetric reflexes knee and ankle bilaterally  Skin: No rash noted.  Psychiatric: He has a normal mood and affect.       Assessment:     #1 complete physical-labs reviewed. His PSAs back down to 0.9 from 3.5. This is very reassuring. Other labs are normal. Colonoscopy up-to-date. Immunizations up-to-date.  #2 right lumbar radiculitis symptoms progressive over several months. He has tingling frequently in the leg region and sharp pain radiating posterior lateral which highly suggest nerve impingement. Not improving with conservative therapy over several months and progressive in severity     Plan:     -Set up MRI lumbar spine to further assess -Continue Celebrex as needed in the meantime -Try reducing sertraline to 50 mg once daily. If libido not improving with that may consider addition of low-dose Wellbutrin -Reassess in 4 months  [name] MD [facility] at [facility]  MRI shows disc bulge L5-S1 with compression of right S1 nerve root. This is likely causing his symptoms. His symptoms are not worse and no better. No improvement with Celebrex. We'll try prednisone taper. Touch base in 2 weeks if not improving. May need surgical referral point at that point if not better.  Patient has been notified of results  [name] MD [facility] at [facility]

## 2016-10-19 ENCOUNTER — Ambulatory Visit
Admission: RE | Admit: 2016-10-19 | Discharge: 2016-10-19 | Disposition: A | Discharge status: Home / Self Care | Payer: Commercial Managed Care - HMO | Source: Ambulatory Visit | Attending: Family Medicine | Admitting: Family Medicine

## 2016-10-19 DIAGNOSIS — M5416 Radiculopathy, lumbar region: Secondary | ICD-10-CM

## 2016-10-19 DIAGNOSIS — M48061 Spinal stenosis, lumbar region without neurogenic claudication: Secondary | ICD-10-CM | POA: Diagnosis not present

## 2016-10-20 MED ORDER — PREDNISONE 10 MG PO TABS: ORAL_TABLET | ORAL | 0 refills | Status: DC

## 2016-10-20 NOTE — Addendum Note (Signed)
Addended by: [name] on: 10/20/2016 08:00 AM   Modules accepted: Orders

## 2016-10-25 ENCOUNTER — Encounter: Payer: Self-pay | Admitting: Family Medicine

## 2016-10-27 ENCOUNTER — Other Ambulatory Visit: Payer: Self-pay

## 2016-10-27 DIAGNOSIS — M5416 Radiculopathy, lumbar region: Secondary | ICD-10-CM

## 2016-11-12 DIAGNOSIS — M5136 Other intervertebral disc degeneration, lumbar region: Secondary | ICD-10-CM | POA: Diagnosis not present

## 2016-12-10 DIAGNOSIS — M5116 Intervertebral disc disorders with radiculopathy, lumbar region: Secondary | ICD-10-CM | POA: Diagnosis not present

## 2016-12-11 DIAGNOSIS — M5416 Radiculopathy, lumbar region: Secondary | ICD-10-CM | POA: Diagnosis not present

## 2016-12-11 DIAGNOSIS — M5126 Other intervertebral disc displacement, lumbar region: Secondary | ICD-10-CM | POA: Diagnosis not present

## 2016-12-11 DIAGNOSIS — M4726 Other spondylosis with radiculopathy, lumbar region: Secondary | ICD-10-CM | POA: Diagnosis not present

## 2016-12-24 DIAGNOSIS — M5136 Other intervertebral disc degeneration, lumbar region: Secondary | ICD-10-CM | POA: Diagnosis not present

## 2017-01-05 ENCOUNTER — Other Ambulatory Visit: Payer: Self-pay | Admitting: Family Medicine

## 2017-01-11 ENCOUNTER — Ambulatory Visit (INDEPENDENT_AMBULATORY_CARE_PROVIDER_SITE_OTHER): Payer: 59 | Admitting: Family Medicine

## 2017-01-11 ENCOUNTER — Encounter: Payer: Self-pay | Admitting: Family Medicine

## 2017-01-11 VITALS — BP 130/80 | HR 71 | Temp 98.2°F | Wt 197.7 lb

## 2017-01-11 DIAGNOSIS — F411 Generalized anxiety disorder: Secondary | ICD-10-CM

## 2017-01-11 NOTE — Progress Notes (Signed)
Pre visit review using our clinic review tool, if applicable. No additional management support is needed unless otherwise documented below in the visit note. 

## 2017-01-11 NOTE — Progress Notes (Signed)
Subjective:     Patient ID: Steven Pollard, male   DOB: March 21, 1956, 61 y.o.   MRN: [id number]  HPI Patient seen for follow-up regarding anxiety and depression. We started sertraline and he has tapered back to 50 mg and both anxiety and depression symptoms are greatly improved  He had some right lumbar radiculitis symptoms last visit and saw neurosurgery and had epidural injection and pain resolved.  Past Medical History:  Diagnosis Date  . Colon polyps   . GERD (gastroesophageal reflux disease)   . History of seizure disorder   . Hyperlipemia   . Prostate disease   . Wears glasses    Past Surgical History:  Procedure Laterality Date  . HERNIA REPAIR  1984   left  . perforated eardrum  1995  . [place]  . TONSILLECTOMY  1962    reports that he quit smoking about 10 years ago. His smoking use included Cigarettes. He has a 34.00 pack-year smoking history. He has never used smokeless tobacco. His alcohol and drug histories are not on file. family history includes Arthritis in his mother; Heart disease (age of onset: 55) in his father. Allergies  Allergen Reactions  . Latex   . Oxycodone-Acetaminophen   . Caffeine Rash     Review of Systems  Constitutional: Negative for fatigue.  Eyes: Negative for visual disturbance.  Respiratory: Negative for cough, chest tightness and shortness of breath.   Cardiovascular: Negative for chest pain, palpitations and leg swelling.  Neurological: Negative for dizziness, syncope, weakness, light-headedness and headaches.       Objective:   Physical Exam  Constitutional: He is oriented to person, place, and time. He appears well-developed and well-nourished.  HENT:  Right Ear: External ear normal.  Left Ear: External ear normal.  Mouth/Throat: Oropharynx is clear and moist.  Eyes: Pupils are equal, round, and reactive to light.  Neck: Neck supple. No thyromegaly present.  Cardiovascular: Normal rate and regular rhythm.    Pulmonary/Chest: Effort normal and breath sounds normal. No respiratory distress. He has no wheezes. He has no rales.  Musculoskeletal: He exhibits no edema.  Neurological: He is alert and oriented to person, place, and time.       Assessment:     Depression and anxiety improved    Plan:     Continue sertraline 50 mg daily. Follow-up for physical next fall and sooner as needed  Steven Post MD [facility] at [facility]

## 2017-03-26 ENCOUNTER — Encounter: Payer: Self-pay | Admitting: Family Medicine

## 2017-03-26 ENCOUNTER — Ambulatory Visit (INDEPENDENT_AMBULATORY_CARE_PROVIDER_SITE_OTHER): Payer: 59 | Admitting: Family Medicine

## 2017-03-26 VITALS — BP 114/78 | HR 78 | Temp 98.0°F | Ht 71.75 in | Wt 205.6 lb

## 2017-03-26 DIAGNOSIS — L509 Urticaria, unspecified: Secondary | ICD-10-CM

## 2017-03-26 MED ORDER — METHYLPREDNISOLONE ACETATE 80 MG/ML IJ SUSP
80.0000 mg | Freq: Once | INTRAMUSCULAR | Status: AC
  Administered 2017-03-26: 80 mg via INTRAMUSCULAR

## 2017-03-26 NOTE — Progress Notes (Signed)
Subjective:  Steven Pollard is a 61 y.o. year old very pleasant male patient who presents for/with See problem oriented charting ROS- no fever or chills. No lip or tongue swelling. No shortness of breath. rash   Past Medical History-  Patient Active Problem List   Diagnosis Date Noted  . Anxiety state 07/11/2016  . Insomnia 07/11/2016  . Increased prostate specific antigen (PSA) velocity 05/13/2016  . GERD (gastroesophageal reflux disease) 03/27/2013  . History of kidney stones 03/27/2013  . History of seizure 03/27/2013  . Dyslipidemia 03/27/2013    Medications- reviewed and updated Current Outpatient Prescriptions  Medication Sig Dispense Refill  . aspirin 81 MG tablet Take 81 mg by mouth daily.      Marland Kitchen augmented betamethasone dipropionate (DIPROLENE-AF) 0.05 % ointment Apply topically 2 (two) times daily.    Marland Kitchen omega-3 acid ethyl esters (LOVAZA) 1 g capsule TAKE 4 CAPSULES DAILY 360 capsule 3  . ranitidine (ZANTAC) 150 MG tablet TAKE 1 TABLET (150 MG TOTAL) BY MOUTH DAILY. 90 tablet 3  . rosuvastatin (CRESTOR) 10 MG tablet TAKE 1 TABLET BY MOUTH EVERY DAY 90 tablet 3  . sertraline (ZOLOFT) 100 MG tablet TAKE 1 TABLET BY MOUTH EVERY DAY (Patient taking differently: TAKE 1/2 TABLET BY MOUTH EVERY DAY) 90 tablet 1   No current facility-administered medications for this visit.     Objective: BP 114/78 (BP Location: Left Arm, Patient Position: Sitting, Cuff Size: Large)   Pulse 78   Temp 98 F (36.7 C) (Oral)   Ht 5' 11.75" (1.822 m)   Wt 205 lb 9.6 oz (93.3 kg)   SpO2 97%   BMI 28.08 kg/m  Gen: NAD, resting comfortably No lip or tongue swelling CV: RRR no murmurs rubs or gallops Lungs: CTAB no crackles, wheeze, rhonchi Ext: no edema Skin: warm, dry Throughout buttocks, upper legs, trunk with multiple patches of red raised lesions (urticaria) some that have formed larger patches such as one area on left flank  Assessment/Plan:  Urticaria S: patient complains of  hives/urticaria. Allergic to caffeine. Been at beach all week- just getting back. Went down last Saturday to beach. Monday had some hives- wife gave him prednisone 30mg  and benadryl. Wednesday coming back and gave another 30mg  prednisone and benadryl. Epigastric pain if takes prednisone too long. Wife is a retired Engineer, drilling. Last night got way worse- took 2 benadryl last night. Decided to come home a day early. Extremely itchy.   Cannot find clear trigger. Did have half and half coffee on Saturday and Sunday- usually has to stay on it for days to cause similar issue. Not ill recently. No insect bites.  No lip or tongue swelling.   Did take a zyrtec 2 hours ago.   No new contacts. No new fabric softeners or sheets- took their own linens. States water felt "sticky". Brought own soap and shampoo. No new foods were eaten- ate similar to what he has at home. No new medications. Does not feel systemically ill. Not immunocompromised. No mucus membrane involvement.   Takes zantac once a day at baseline for reflux  Apparently has had this years ago and found to have caffeine and latex allergy by allergist. Not listed in problem list or history.   A/P: Patient Instructions  Hives of unclear etiology. Possibly related to caffeine intake.   Schedule zyrtec each night  Take zantac twice a day  Depo- medrol 80mg  injection today  Contact us immediately for any shortness of breath or lip or tongue  swelling or if not improving by Monday  Or if recurs in future   Return precautions advised.  Garret Reddish, MD

## 2017-03-26 NOTE — Addendum Note (Signed)
Addended by: Mariam Dollar, Roselyn Reef M on: 03/26/2017 04:06 PM   Modules accepted: Orders

## 2017-03-26 NOTE — Patient Instructions (Signed)
Hives of unclear etiology. Possibly related to caffeine intake.   Schedule zyrtec each night  Take zantac twice a day  Depo- medrol 80mg  injection today  Contact us immediately for any shortness of breath or lip or tongue swelling or if not improving by Monday  Or if recurs in future

## 2017-03-27 ENCOUNTER — Emergency Department (HOSPITAL_COMMUNITY)
Admission: EM | Admit: 2017-03-27 | Discharge: 2017-03-27 | Disposition: A | Discharge status: Home / Self Care | Payer: 59 | Attending: Emergency Medicine | Admitting: Emergency Medicine

## 2017-03-27 ENCOUNTER — Encounter (HOSPITAL_COMMUNITY): Payer: Self-pay | Admitting: Emergency Medicine

## 2017-03-27 DIAGNOSIS — Z87891 Personal history of nicotine dependence: Secondary | ICD-10-CM | POA: Diagnosis not present

## 2017-03-27 DIAGNOSIS — T7800XD Anaphylactic reaction due to unspecified food, subsequent encounter: Secondary | ICD-10-CM | POA: Diagnosis not present

## 2017-03-27 DIAGNOSIS — I951 Orthostatic hypotension: Secondary | ICD-10-CM

## 2017-03-27 DIAGNOSIS — Z91040 Latex allergy status: Secondary | ICD-10-CM | POA: Diagnosis not present

## 2017-03-27 DIAGNOSIS — T7840XD Allergy, unspecified, subsequent encounter: Secondary | ICD-10-CM

## 2017-03-27 DIAGNOSIS — Z79899 Other long term (current) drug therapy: Secondary | ICD-10-CM | POA: Insufficient documentation

## 2017-03-27 DIAGNOSIS — I959 Hypotension, unspecified: Secondary | ICD-10-CM | POA: Diagnosis not present

## 2017-03-27 DIAGNOSIS — L500 Allergic urticaria: Secondary | ICD-10-CM | POA: Insufficient documentation

## 2017-03-27 DIAGNOSIS — L509 Urticaria, unspecified: Secondary | ICD-10-CM | POA: Diagnosis present

## 2017-03-27 LAB — BASIC METABOLIC PANEL
Anion gap: 8 (ref 5–15)
BUN: 11 mg/dL (ref 6–20)
CALCIUM: 9.6 mg/dL (ref 8.9–10.3)
CHLORIDE: 102 mmol/L (ref 101–111)
CO2: 25 mmol/L (ref 22–32)
CREATININE: 0.94 mg/dL (ref 0.61–1.24)
Glucose, Bld: 120 mg/dL — ABNORMAL HIGH (ref 65–99)
Potassium: 4 mmol/L (ref 3.5–5.1)
SODIUM: 135 mmol/L (ref 135–145)

## 2017-03-27 LAB — URINALYSIS, ROUTINE W REFLEX MICROSCOPIC
Bilirubin Urine: NEGATIVE
GLUCOSE, UA: NEGATIVE mg/dL
Hgb urine dipstick: NEGATIVE
KETONES UR: NEGATIVE mg/dL
LEUKOCYTES UA: NEGATIVE
Nitrite: NEGATIVE
PROTEIN: NEGATIVE mg/dL
Specific Gravity, Urine: 1.019 (ref 1.005–1.030)
pH: 7 (ref 5.0–8.0)

## 2017-03-27 LAB — CBC
HCT: 51 % (ref 39.0–52.0)
Hemoglobin: 17 g/dL (ref 13.0–17.0)
MCH: 32.4 pg (ref 26.0–34.0)
MCHC: 33.3 g/dL (ref 30.0–36.0)
MCV: 97.1 fL (ref 78.0–100.0)
PLATELETS: 175 10*3/uL (ref 150–400)
RBC: 5.25 MIL/uL (ref 4.22–5.81)
RDW: 13.6 % (ref 11.5–15.5)
WBC: 11.4 10*3/uL — AB (ref 4.0–10.5)

## 2017-03-27 MED ORDER — FAMOTIDINE IN NACL 20-0.9 MG/50ML-% IV SOLN
20.0000 mg | Freq: Once | INTRAVENOUS | Status: AC
  Administered 2017-03-27: 20 mg via INTRAVENOUS
  Filled 2017-03-27: qty 50

## 2017-03-27 MED ORDER — SODIUM CHLORIDE 0.9 % IV BOLUS (SEPSIS)
1000.0000 mL | Freq: Once | INTRAVENOUS | Status: AC
  Administered 2017-03-27: 1000 mL via INTRAVENOUS

## 2017-03-27 MED ORDER — METHYLPREDNISOLONE SODIUM SUCC 125 MG IJ SOLR
125.0000 mg | Freq: Once | INTRAMUSCULAR | Status: AC
  Administered 2017-03-27: 125 mg via INTRAVENOUS
  Filled 2017-03-27: qty 2

## 2017-03-27 MED ORDER — PREDNISONE 20 MG PO TABS: ORAL_TABLET | ORAL | 0 refills | Status: DC

## 2017-03-27 MED ORDER — EPINEPHRINE 0.3 MG/0.3ML IJ SOAJ: 0.3000 mg | Freq: Once | INTRAMUSCULAR | 0 refills | Status: AC | PRN

## 2017-03-27 NOTE — ED Provider Notes (Signed)
[facility] DEPT Provider Note   CSN: [id number] Arrival date & time: 03/27/17  0755     History   Chief Complaint Chief Complaint  Patient presents with  . Rash  . Allergic Reaction  . Dizziness    HPI Steven Pollard is a 61 y.o. male.  HPI Patient presents with hives to the abdomen, back and extremities. This evening on for several days. He's been taking Benadryl with no improvement. Saw his primary physician yesterday and had a steroid injection. Patient states this morning he became lightheaded with standing up. His wife took his blood pressure and noted that his systolic was in the 95J with standing. Currently denies any lightheadedness or weakness. States he believes his rash is improving. No new known exposures and has been at the beach. Denies any new medication. Denies nausea or vomiting. No oral swelling or difficulty breathing. Past Medical History:  Diagnosis Date  . Colon polyps   . GERD (gastroesophageal reflux disease)   . History of seizure disorder   . Hyperlipemia   . Prostate disease   . Wears glasses     Patient Active Problem List   Diagnosis Date Noted  . Anxiety state 07/11/2016  . Insomnia 07/11/2016  . Increased prostate specific antigen (PSA) velocity 05/13/2016  . GERD (gastroesophageal reflux disease) 03/27/2013  . History of kidney stones 03/27/2013  . History of seizure 03/27/2013  . Dyslipidemia 03/27/2013    Past Surgical History:  Procedure Laterality Date  . HERNIA REPAIR  1984   left  . perforated eardrum  1995  . [place]  . TONSILLECTOMY  1962       Home Medications    Prior to Admission medications   Medication Sig Start Date End Date Taking? Authorizing Provider  aspirin 81 MG tablet Take 81 mg by mouth daily.     Yes [provider]  augmented betamethasone dipropionate (DIPROLENE-AF) 0.05 % ointment Apply topically 2 (two) times daily.   Yes [provider]  diphenhydrAMINE  (BENADRYL) 25 MG tablet Take 25 mg by mouth every 6 (six) hours as needed for itching or sleep.   Yes [provider]  omega-3 acid ethyl esters (LOVAZA) 1 g capsule TAKE 4 CAPSULES DAILY 09/09/16  Yes Burchette, Alinda Sierras, MD  ranitidine (ZANTAC) 150 MG tablet TAKE 1 TABLET (150 MG TOTAL) BY MOUTH DAILY. 09/09/16  Yes Burchette, Alinda Sierras, MD  rosuvastatin (CRESTOR) 10 MG tablet TAKE 1 TABLET BY MOUTH EVERY DAY 09/09/16  Yes Burchette, Alinda Sierras, MD  sertraline (ZOLOFT) 100 MG tablet TAKE 1 TABLET BY MOUTH EVERY DAY Patient taking differently: TAKE 1/2 TABLET BY MOUTH EVERY DAY 01/05/17  Yes Burchette, Alinda Sierras, MD  predniSONE (DELTASONE) 20 MG tablet 3 tabs po daily x 3 days, then 2 tabs x 3 days, then 1.5 tabs x 3 days, then 1 tab x 3 days, then 0.5 tabs x 3 days 03/28/17   Julianne Rice, MD    Family History Family History  Problem Relation Age of Onset  . Arthritis Mother   . Heart disease Father 20       heart attack    Social History Social History  Substance Use Topics  . Smoking status: Former Smoker    Packs/day: 1.00    Years: 34.00    Types: Cigarettes    Quit date: 2008  . Smokeless tobacco: Never Used     Comment: Encouraged to remain smoke free  . Alcohol use Not on  file     Allergies   Latex; Oxycodone-acetaminophen; and Caffeine   Review of Systems Review of Systems  Constitutional: Negative for chills and fever.  HENT: Negative for facial swelling, trouble swallowing and voice change.   Eyes: Negative for visual disturbance.  Respiratory: Negative for cough and shortness of breath.   Cardiovascular: Negative for chest pain.  Gastrointestinal: Negative for abdominal pain, diarrhea, nausea and vomiting.  Musculoskeletal: Negative for back pain, neck pain and neck stiffness.  Skin: Positive for rash. Negative for wound.  Neurological: Positive for dizziness and light-headedness. Negative for syncope, weakness, numbness and headaches.  All other systems  reviewed and are negative.    Physical Exam Updated Vital Signs BP 115/74   Pulse 67   Temp 98 F (36.7 C) (Oral)   Resp 17   SpO2 92%   Physical Exam  Constitutional: He is oriented to person, place, and time. He appears well-developed and well-nourished. No distress.  HENT:  Head: Normocephalic and atraumatic.  Mouth/Throat: Oropharynx is clear and moist. No oropharyngeal exudate.  No intraoral swelling or rashes.  Eyes: EOM are normal. Pupils are equal, round, and reactive to light.  Neck: Normal range of motion. Neck supple.  Cardiovascular: Normal rate and regular rhythm.  Exam reveals no gallop and no friction rub.   No murmur heard. Pulmonary/Chest: Effort normal and breath sounds normal. No respiratory distress. He has no wheezes. He has no rales. He exhibits no tenderness.  Abdominal: Soft. Bowel sounds are normal. There is no tenderness. There is no rebound and no guarding.  Musculoskeletal: Normal range of motion. He exhibits no edema or tenderness.  Lymphadenopathy:    He has no cervical adenopathy.  Neurological: He is alert and oriented to person, place, and time.  Moving all extremities without focal deficit. Sensation intact.  Skin: Skin is warm and dry. Rash noted. No erythema.  Raised erythematous rash to trunk, back and bilateral extremities. Consistent with hives.  Psychiatric: He has a normal mood and affect. His behavior is normal.  Nursing note and vitals reviewed.    ED Treatments / Results  Labs (all labs ordered are listed, but only abnormal results are displayed) Labs Reviewed  BASIC METABOLIC PANEL - Abnormal; Notable for the following:       Result Value   Glucose, Bld 120 (*)    All other components within normal limits  CBC - Abnormal; Notable for the following:    WBC 11.4 (*)    All other components within normal limits  URINALYSIS, ROUTINE W REFLEX MICROSCOPIC - Abnormal; Notable for the following:    APPearance HAZY (*)    All other  components within normal limits    EKG  EKG Interpretation None       Radiology No results found.  Procedures Procedures (including critical care time)  Medications Ordered in ED Medications  methylPREDNISolone sodium succinate (SOLU-MEDROL) 125 mg/2 mL injection 125 mg (125 mg Intravenous Given 03/27/17 0947)  sodium chloride 0.9 % bolus 1,000 mL (0 mLs Intravenous Stopped 03/27/17 1102)  famotidine (PEPCID) IVPB 20 mg premix (0 mg Intravenous Stopped 03/27/17 1102)     Initial Impression / Assessment and Plan / ED Course  I have reviewed the triage vital signs and the nursing notes.  Pertinent labs & imaging results that were available during my care of the patient were reviewed by me and considered in my medical decision making (see chart for details).     Patient states he is feeling  better after medication and IV fluids. Significantly improved. Denies any symptoms currently. We'll give tapered course of prednisone the patient is instructed to continue Benadryl as needed. Advised to follow with his primary physician and may need allergist referral if symptoms persist. Return precautions given.  Final Clinical Impressions(s) / ED Diagnoses   Final diagnoses:  Allergic reaction, subsequent encounter  Orthostatic hypotension    New Prescriptions New Prescriptions   PREDNISONE (DELTASONE) 20 MG TABLET    3 tabs po daily x 3 days, then 2 tabs x 3 days, then 1.5 tabs x 3 days, then 1 tab x 3 days, then 0.5 tabs x 3 days     Julianne Rice, MD 03/27/17 1251

## 2017-03-27 NOTE — ED Notes (Signed)
Got patient undress on the monitor family is at bedside patient is resting

## 2017-03-27 NOTE — ED Triage Notes (Addendum)
Per pt, "At the beach last week I noticed a rash on my stomach, and i've been taking benadryl. Also my blood pressure dropped this morning when I stood up, I was up for a while and I started feeling lightheaded, I was up for about 5 minutes, it was 120/80 when I was lying down, when I stood up it was 118, and after about 5 minutes my systolic was 60 over something". Pt took 25 mg benadryl at 630. Pt in NAD, VSS, resp e/u.

## 2017-03-27 NOTE — ED Triage Notes (Signed)
Pt denies dizziness or lightheadedness at this time.

## 2017-03-30 ENCOUNTER — Ambulatory Visit (INDEPENDENT_AMBULATORY_CARE_PROVIDER_SITE_OTHER): Payer: 59 | Admitting: Family Medicine

## 2017-03-30 ENCOUNTER — Encounter: Payer: Self-pay | Admitting: Family Medicine

## 2017-03-30 ENCOUNTER — Ambulatory Visit: Payer: Self-pay | Admitting: Family Medicine

## 2017-03-30 VITALS — BP 140/80 | HR 75 | Temp 98.3°F | Wt 206.2 lb

## 2017-03-30 DIAGNOSIS — T783XXA Angioneurotic edema, initial encounter: Secondary | ICD-10-CM

## 2017-03-30 DIAGNOSIS — L509 Urticaria, unspecified: Secondary | ICD-10-CM | POA: Diagnosis not present

## 2017-03-30 NOTE — Progress Notes (Signed)
Subjective:     Patient ID: Steven Pollard, male   DOB: May 28, 1956, 61 y.o.   MRN: [id number]  HPI Patient seen for evaluation regarding recent severe urticaria and possible angioedema reaction. Is accompanied by his wife (who previously practiced family medicine). They were at the beach last week and started developing apparently some hives on his trunk and extremities then and these had progressed.  He was seen here on the 15th with diffuse urticaria and was given Depo-Medrol (was already taking Zyrtec) and was advised to take H2 blocker. They started Zantac twice daily.  By Saturday, wife noted that his hands seemed very swollen and she felt both his upper and lower lip seemed more edematous. No tongue edema. He got up Saturday morning and she states he looked pale and initial blood pressure 120/80 and after standing systolic dropped around 60. She gave him Benadryl and symptoms seem to be improving. He was driven to the ER that point. He received Solu-Medrol and was prescribed oral prednisone taper. Continued hives and this week but started improving yesterday.  He's had some urticaria in the past but not to this extent. Wife discontinued his aspirin last week and also took him off fish oil. He's not had any known history of food allergies other than as noted hives associated with increased caffeine use in the past. He was prescribed EpiPen in the ER  Reports multiple tick bites this year.  Past Medical History:  Diagnosis Date  . Colon polyps   . GERD (gastroesophageal reflux disease)   . History of seizure disorder   . Hyperlipemia   . Prostate disease   . Wears glasses    Past Surgical History:  Procedure Laterality Date  . HERNIA REPAIR  1984   left  . perforated eardrum  1995  . [place]  . TONSILLECTOMY  1962    reports that he quit smoking about 10 years ago. His smoking use included Cigarettes. He has a 34.00 pack-year smoking history. He has never used smokeless  tobacco. His alcohol and drug histories are not on file. family history includes Arthritis in his mother; Heart disease (age of onset: 26) in his father. Allergies  Allergen Reactions  . Latex     Unknown   . Oxycodone-Acetaminophen     dizzines   . Caffeine Rash     Review of Systems  Constitutional: Negative for chills and fever.  HENT: Negative for congestion and trouble swallowing.   Respiratory: Negative for shortness of breath.   Cardiovascular: Negative for chest pain.  Gastrointestinal: Negative for abdominal pain.  Skin: Positive for rash.       Objective:   Physical Exam  Constitutional: He appears well-developed and well-nourished. No distress.  HENT:  Mouth/Throat: Oropharynx is clear and moist.  Neck: Neck supple.  Cardiovascular: Normal rate and regular rhythm.   Pulmonary/Chest: Effort normal and breath sounds normal. No respiratory distress. He has no wheezes. He has no rales.  Musculoskeletal: He exhibits no edema.  Lymphadenopathy:    He has no cervical adenopathy.  Skin:  Urticaria have essentially faded on his trunk and extremities       Assessment:     Severe generalized urticaria with what sounds like early angioedema symptoms.  Etiology unclear at this time.    Plan:     -Continue to hold aspirin -Continue daily Zyrtec and Zantac for now and finish out prednisone taper -Set up to see allergist for further evaluation -They already  have EpiPen at home for as needed use -Check alpha gal panel  [name] MD [facility]

## 2017-03-30 NOTE — Patient Instructions (Signed)
Angioedema Angioedema is the sudden swelling of tissue in the body. Angioedema can affect any part of the body, but it most often affects the deeper parts of the skin, causing red, itchy patches (hives) to appear over the affected area. It often begins during the night and is found in the morning. Depending on the cause, angioedema may happen:  Only once.  Several times. It may come back in unpredictable patterns.  Repeatedly for several years. Over time, it may gradually stop coming back.  Angioedema can be life-threatening if it affects the air passages that you breathe through. What are the causes? This condition may be caused by:  Foods, such as milk, eggs, shellfish, wheat, or nuts.  Certain medicines, such as ACE inhibitors, antibiotics, nonsteroidal anti-inflammatory drugs, birth control pills, or dyes used in X-rays.  Insect stings.  Infections.  Angioedema can be inherited, and episodes can be triggered by:  Mild injury.  Dental work.  Surgery.  Stress.  Sudden changes in temperature.  Exercise.  In some cases, the cause of this condition is not known. What are the signs or symptoms? Symptoms of this condition depend on where the swelling happens. Symptoms may include:  Swollen skin.  Red, itchy patches of skin (hives).  Redness in the affected area.  Pain in the affected area.  Swollen lips or tongue.  Wheezing.  Breathing problems.  If your internal organs are involved, symptoms may also include:  Nausea.  Abdominal pain.  Vomiting.  Difficulty swallowing.  Difficulty passing urine.  How is this diagnosed? This condition may be diagnosed based on:  An exam of the affected area.  Your medical history.  Whether anyone in your family has had this condition before.  A review of any medicines you have been taking.  Tests, including: ? Allergy skin tests to see if the condition was caused by an allergic reaction. ? Blood tests to see  if the condition was caused by a gene. ? Tests to check for underlying diseases that could cause the condition.  How is this treated? Treatment for this condition depends on the cause. It may involve any of the following:  If something triggered the condition, making changes to keep it from triggering the condition again.  If the condition affects your breathing, having tubes placed in your airway to keep it open.  Taking medicines to treat symptoms or prevent future episodes. These may include: ? Antihistamines. ? Epinephrine injections. ? Steroids.  If your condition is severe, you may need to be treated at the hospital. Angioedema usually gets better in 24-48 hours. Follow these instructions at home:  Take over-the-counter and prescription medicines only as told by your health care provider.  If you were given medicines for emergency allergy treatment, always carry them with you.  Wear a medical bracelet as told by your health care provider.  If something triggers your condition, avoid the trigger, if possible.  If your condition is inherited and you are thinking about having children, talk to your health care provider. It is important to discuss the risks of passing on the condition to your children. Contact a health care provider if:  You have repeated episodes of angioedema.  Episodes of angioedema start to happen more often than they used to, even after you take steps to prevent them.  You have episodes of angioedema that are more severe than they have been before, even after you take steps to prevent them.  You are thinking about having children. Get   help right away if:  You have severe swelling of your mouth, tongue, or lips.  You have trouble breathing.  You have trouble swallowing.  You faint. This information is not intended to replace advice given to you by your health care provider. Make sure you discuss any questions you have with your health care  provider. Document Released: 12/07/2001 Document Revised: 04/25/2016 Document Reviewed: 04/07/2016 Elsevier Interactive Patient Education  2018 [employer]  

## 2017-04-02 LAB — ALPHA-GAL PANEL
BEEF IGE: 1.41 kU/L — AB (ref <0.35)
CLASS: 1
Class: 2
GALACTOSE-ALPHA-1,3-GALACTOSE IGE: 7.19 kU/L — AB (ref <0.35)
Lamb/Mutton IgE: 0.33 kU/L (ref <0.35)
Pork IgE: 0.62 kU/L — ABNORMAL HIGH (ref <0.35)

## 2017-04-15 DIAGNOSIS — H4322 Crystalline deposits in vitreous body, left eye: Secondary | ICD-10-CM | POA: Diagnosis not present

## 2017-05-04 ENCOUNTER — Ambulatory Visit (INDEPENDENT_AMBULATORY_CARE_PROVIDER_SITE_OTHER): Payer: 59 | Admitting: Allergy and Immunology

## 2017-05-04 ENCOUNTER — Encounter: Payer: Self-pay | Admitting: Allergy and Immunology

## 2017-05-04 ENCOUNTER — Telehealth: Payer: Self-pay | Admitting: Allergy and Immunology

## 2017-05-04 VITALS — BP 134/84 | HR 84 | Temp 97.5°F | Resp 22 | Ht 71.0 in | Wt 195.0 lb

## 2017-05-04 DIAGNOSIS — J3089 Other allergic rhinitis: Secondary | ICD-10-CM

## 2017-05-04 DIAGNOSIS — T7800XD Anaphylactic reaction due to unspecified food, subsequent encounter: Secondary | ICD-10-CM

## 2017-05-04 NOTE — Progress Notes (Signed)
Dear Dr. Elease Hashimoto,  Thank you for referring Steven Pollard to the [facility] on 05/04/2017.   Below is a summation of this patient's evaluation and recommendations.  Thank you for your referral. I will keep you informed about this patient's response to treatment.   If you have any questions please do not hesitate to contact me.   Sincerely,  Jiles Prows, MD Allergy / Startup   ______________________________________________________________________    NEW PATIENT NOTE  Referring Provider: [name], MD Primary Provider: [name], MD Date of office visit: 05/04/2017    Subjective:   Chief Complaint:  Steven Pollard (DOB: 05-29-1956) is a 61 y.o. male who presents to the clinic on 05/04/2017 with a chief complaint of No chief complaint on file. Marland Kitchen     HPI: Steven Pollard presents to this clinic in evaluation of a reaction that occurred mid June 2018 where he developed recurrent urticarial lesions and angioedema over the course of approximately 5-7 days associated with a blood pressure of 60 requiring emergency room evaluation and the delivery of multiple medications including several courses of systemic steroids to get this under control. He visited with his primary care doctor who obtained a alpha gal titer which was elevated and he removed all red meat from his diet and his reactivity completely abated and he has not had any problems in almost 4 weeks. He has stopped all of his antihistamines the past several weeks.  There was no other obvious provoking factor giving rise to this issue. He did not use any new medications or have any associated systemic or constitutional symptoms. He does have a history of seasonal allergic rhinitis for which he takes Zyrtec successfully. He does have a history of having some intermittent hives in the past if he drink large  amounts of caffeine but small amounts are okay.  Past Medical History:  Diagnosis Date  . Colon polyps   . GERD (gastroesophageal reflux disease)   . History of seizure disorder   . Hyperlipemia   . Prostate disease   . Wears glasses     Past Surgical History:  Procedure Laterality Date  . HERNIA REPAIR  1984   left  . perforated eardrum  1995  . [place]  . TONSILLECTOMY  1962    Allergies as of 05/04/2017      Reactions   Latex    Unknown    Oxycodone-acetaminophen    dizzines    Tyloxapol Other (See Comments)   vertigo   Caffeine Rash      Medication List      augmented betamethasone dipropionate 0.05 % ointment Commonly known as:  DIPROLENE-AF Apply topically 2 (two) times daily.   cetirizine 10 MG tablet Commonly known as:  ZYRTEC Take 10 mg by mouth daily.   EPINEPHrine 0.3 mg/0.3 mL Soaj injection Commonly known as:  EPIPEN 2-PAK Inject 0.3 mLs (0.3 mg total) into the muscle once as needed (difficulty breathing, hypotension, oral swelling).   omega-3 acid ethyl esters 1 g capsule Commonly known as:  LOVAZA TAKE 4 CAPSULES DAILY   ranitidine 150 MG tablet Commonly known as:  ZANTAC TAKE 1 TABLET (150 MG TOTAL) BY MOUTH DAILY.   rosuvastatin 10 MG tablet Commonly known as:  CRESTOR TAKE 1 TABLET BY MOUTH EVERY DAY       Review of systems negative except as  noted in HPI / PMHx or noted below:  Review of Systems  Constitutional: Negative.   HENT: Negative.   Eyes: Negative.   Respiratory: Negative.   Cardiovascular: Negative.   Gastrointestinal: Negative.   Genitourinary: Negative.   Musculoskeletal: Negative.   Skin: Negative.   Neurological: Negative.   Endo/Heme/Allergies: Negative.   Psychiatric/Behavioral: Negative.     Family History  Problem Relation Age of Onset  . Arthritis Mother   . Heart disease Father 87       heart attack    Social History   Social History  . Marital status: Married    Spouse name:  N/A  . Number of children: N/A  . Years of education: N/A   Occupational History  . Not on file.   Social History Main Topics  . Smoking status: Former Smoker    Packs/day: 1.00    Years: 34.00    Types: Cigarettes    Quit date: 2008  . Smokeless tobacco: Never Used     Comment: Encouraged to remain smoke free  . Alcohol use Yes     Comment: socially  . Drug use: No  . Sexual activity: Not on file   Other Topics Concern  . Not on file   Social History Narrative  . No narrative on file    Environmental and Social history  Lives in a house with a dry environment,  Dogs located inside the household, no carpeting in the bedroom, no plastic on the bed, no plastic on the pillow, no smokers located inside the household. Objective:   Vitals:   05/04/17 0807  BP: 134/84  Pulse: 84  Resp: (!) 22  Temp: (!) 97.5 F (36.4 C)   Height: 5\' 11"  (180.3 cm) Weight: 195 lb (88.5 kg)  Physical Exam  Constitutional: He is well-developed, well-nourished, and in no distress.  HENT:  Head: Normocephalic. Head is without right periorbital erythema and without left periorbital erythema.  Right Ear: Tympanic membrane, external ear and ear canal normal.  Left Ear: Tympanic membrane, external ear and ear canal normal.  Nose: Nose normal. No mucosal edema or rhinorrhea.  Mouth/Throat: Oropharynx is clear and moist and mucous membranes are normal. No oropharyngeal exudate.  Eyes: Pupils are equal, round, and reactive to light. Conjunctivae and lids are normal.  Neck: Trachea normal. No tracheal deviation present. No thyromegaly present.  Cardiovascular: Normal rate, regular rhythm, S1 normal, S2 normal and normal heart sounds.   No murmur heard. Pulmonary/Chest: Effort normal. No stridor. No tachypnea. No respiratory distress. He has no wheezes. He has no rales. He exhibits no tenderness.  Abdominal: Soft. He exhibits no distension and no mass. There is no hepatosplenomegaly. There is no  tenderness. There is no rebound and no guarding.  Musculoskeletal: He exhibits no edema or tenderness.  Lymphadenopathy:       Head (right side): No tonsillar adenopathy present.       Head (left side): No tonsillar adenopathy present.    He has no cervical adenopathy.    He has no axillary adenopathy.  Neurological: He is alert. Gait normal.  Skin: No rash noted. He is not diaphoretic. No erythema. No pallor. Nails show no clubbing.  Psychiatric: Mood and affect normal.    Diagnostics: Allergy skin tests were not performed.   Results of blood tests obtained 11/05/2016 identified a alpha gal IgE titer of 7.19 KU/L with IgE antibodies directed against beef at 1.41 and pork at 0.62 KU/L respectively.   Assessment and  Plan:    1. Anaphylactic shock due to food, subsequent encounter   2. Other allergic rhinitis     1. Allergen avoidance measures directed against mammal  2. EpiPen, Benadryl, M.D./ER evaluation for allergic reaction  3. OTC Zyrtec if needed  4. Return to clinic in 1 year or earlier if problem  5. Obtain fall flu vaccine  I think there is enough evidence to support the diagnosis of alpha gal syndrome in Steven Pollard's case and we will not have him undergo any further evaluation for other etiologic factors that may have contributed to his episode in June. Of course, if he has recurrent reactions in the future unrelated to mammal consumption he will contact me for further evaluation and treatment. We will check an alpha gal titer every year to see if this issue is resolving.  Jiles Prows, MD [facility]

## 2017-05-04 NOTE — Patient Instructions (Addendum)
  1. Allergen avoidance measures directed against mammal  2. EpiPen, Benadryl, M.D./ER evaluation for allergic reaction  3. OTC Zyrtec if needed  4. Return to clinic in 1 year or earlier if problem  5. Obtain fall flu vaccine

## 2017-05-17 ENCOUNTER — Other Ambulatory Visit: Payer: Self-pay | Admitting: *Deleted

## 2017-05-17 ENCOUNTER — Encounter: Payer: Self-pay | Admitting: Family Medicine

## 2017-05-17 DIAGNOSIS — L820 Inflamed seborrheic keratosis: Secondary | ICD-10-CM | POA: Diagnosis not present

## 2017-05-17 DIAGNOSIS — L570 Actinic keratosis: Secondary | ICD-10-CM | POA: Diagnosis not present

## 2017-05-17 MED ORDER — TRAMADOL HCL 50 MG PO TABS: ORAL_TABLET | ORAL | 0 refills | Status: DC

## 2017-05-17 NOTE — Telephone Encounter (Signed)
Rx done and the pt was notified via Mychart message. 

## 2017-06-02 DIAGNOSIS — M5416 Radiculopathy, lumbar region: Secondary | ICD-10-CM | POA: Diagnosis not present

## 2017-06-15 DIAGNOSIS — M5416 Radiculopathy, lumbar region: Secondary | ICD-10-CM | POA: Diagnosis not present

## 2017-06-15 DIAGNOSIS — M545 Low back pain: Secondary | ICD-10-CM | POA: Diagnosis not present

## 2017-07-01 ENCOUNTER — Encounter: Payer: Self-pay | Admitting: Family Medicine

## 2017-07-07 ENCOUNTER — Encounter: Payer: Self-pay | Admitting: Acute Care

## 2017-07-09 DIAGNOSIS — M5126 Other intervertebral disc displacement, lumbar region: Secondary | ICD-10-CM | POA: Diagnosis not present

## 2017-07-09 DIAGNOSIS — M5117 Intervertebral disc disorders with radiculopathy, lumbosacral region: Secondary | ICD-10-CM | POA: Diagnosis not present

## 2017-07-09 DIAGNOSIS — M5127 Other intervertebral disc displacement, lumbosacral region: Secondary | ICD-10-CM | POA: Diagnosis not present

## 2017-07-17 ENCOUNTER — Other Ambulatory Visit: Payer: Self-pay | Admitting: Family Medicine

## 2017-07-19 NOTE — Telephone Encounter (Signed)
Refill OK

## 2017-08-12 ENCOUNTER — Other Ambulatory Visit: Payer: Self-pay | Admitting: Acute Care

## 2017-08-12 DIAGNOSIS — Z122 Encounter for screening for malignant neoplasm of respiratory organs: Secondary | ICD-10-CM

## 2017-08-12 DIAGNOSIS — Z87891 Personal history of nicotine dependence: Secondary | ICD-10-CM

## 2017-08-29 ENCOUNTER — Other Ambulatory Visit: Payer: Self-pay | Admitting: Family Medicine

## 2017-09-01 ENCOUNTER — Ambulatory Visit
Admission: RE | Admit: 2017-09-01 | Discharge: 2017-09-01 | Disposition: A | Discharge status: Home / Self Care | Payer: 59 | Source: Ambulatory Visit | Attending: Acute Care | Admitting: Acute Care

## 2017-09-01 DIAGNOSIS — Z87891 Personal history of nicotine dependence: Secondary | ICD-10-CM

## 2017-09-01 DIAGNOSIS — Z122 Encounter for screening for malignant neoplasm of respiratory organs: Secondary | ICD-10-CM

## 2017-09-08 ENCOUNTER — Other Ambulatory Visit: Payer: Self-pay | Admitting: Acute Care

## 2017-09-08 DIAGNOSIS — Z87891 Personal history of nicotine dependence: Secondary | ICD-10-CM

## 2017-09-08 DIAGNOSIS — Z122 Encounter for screening for malignant neoplasm of respiratory organs: Secondary | ICD-10-CM

## 2017-09-27 DIAGNOSIS — L820 Inflamed seborrheic keratosis: Secondary | ICD-10-CM | POA: Diagnosis not present

## 2017-09-27 DIAGNOSIS — L821 Other seborrheic keratosis: Secondary | ICD-10-CM | POA: Diagnosis not present

## 2017-09-27 DIAGNOSIS — D1801 Hemangioma of skin and subcutaneous tissue: Secondary | ICD-10-CM | POA: Diagnosis not present

## 2017-09-27 DIAGNOSIS — L812 Freckles: Secondary | ICD-10-CM | POA: Diagnosis not present

## 2017-09-29 ENCOUNTER — Other Ambulatory Visit: Payer: Self-pay | Admitting: Family Medicine

## 2017-10-22 ENCOUNTER — Ambulatory Visit (INDEPENDENT_AMBULATORY_CARE_PROVIDER_SITE_OTHER): Payer: 59 | Admitting: Family Medicine

## 2017-10-22 VITALS — BP 120/78 | HR 62 | Temp 98.2°F | Ht 70.25 in | Wt 204.1 lb

## 2017-10-22 DIAGNOSIS — Z125 Encounter for screening for malignant neoplasm of prostate: Secondary | ICD-10-CM

## 2017-10-22 DIAGNOSIS — Z23 Encounter for immunization: Secondary | ICD-10-CM | POA: Diagnosis not present

## 2017-10-22 DIAGNOSIS — Z0000 Encounter for general adult medical examination without abnormal findings: Secondary | ICD-10-CM

## 2017-10-22 LAB — CBC WITH DIFFERENTIAL/PLATELET
BASOS ABS: 0 10*3/uL (ref 0.0–0.1)
BASOS PCT: 0.6 % (ref 0.0–3.0)
EOS ABS: 0 10*3/uL (ref 0.0–0.7)
Eosinophils Relative: 0.5 % (ref 0.0–5.0)
HEMATOCRIT: 45 % (ref 39.0–52.0)
HEMOGLOBIN: 15.3 g/dL (ref 13.0–17.0)
LYMPHS PCT: 35.5 % (ref 12.0–46.0)
Lymphs Abs: 1.4 10*3/uL (ref 0.7–4.0)
MCHC: 34 g/dL (ref 30.0–36.0)
MCV: 96.5 fl (ref 78.0–100.0)
Monocytes Absolute: 0.5 10*3/uL (ref 0.1–1.0)
Monocytes Relative: 12.7 % — ABNORMAL HIGH (ref 3.0–12.0)
Neutro Abs: 2 10*3/uL (ref 1.4–7.7)
Neutrophils Relative %: 50.7 % (ref 43.0–77.0)
Platelets: 174 10*3/uL (ref 150.0–400.0)
RBC: 4.66 Mil/uL (ref 4.22–5.81)
RDW: 13 % (ref 11.5–15.5)
WBC: 3.9 10*3/uL — AB (ref 4.0–10.5)

## 2017-10-22 LAB — BASIC METABOLIC PANEL
BUN: 13 mg/dL (ref 6–23)
CHLORIDE: 105 meq/L (ref 96–112)
CO2: 30 mEq/L (ref 19–32)
CREATININE: 0.83 mg/dL (ref 0.40–1.50)
Calcium: 9.5 mg/dL (ref 8.4–10.5)
GFR: 99.18 mL/min (ref >60.00)
Glucose, Bld: 104 mg/dL — ABNORMAL HIGH (ref 70–99)
Potassium: 3.8 mEq/L (ref 3.5–5.1)
Sodium: 141 mEq/L (ref 135–145)

## 2017-10-22 LAB — HEPATIC FUNCTION PANEL
ALK PHOS: 56 U/L (ref 39–117)
ALT: 23 U/L (ref 0–53)
AST: 20 U/L (ref 0–37)
Albumin: 4.6 g/dL (ref 3.5–5.2)
BILIRUBIN DIRECT: 0.2 mg/dL (ref 0.0–0.3)
BILIRUBIN TOTAL: 1.2 mg/dL (ref 0.2–1.2)
Total Protein: 6.5 g/dL (ref 6.0–8.3)

## 2017-10-22 LAB — LIPID PANEL
CHOLESTEROL: 122 mg/dL (ref 0–200)
HDL: 36 mg/dL — ABNORMAL LOW (ref >39.00)
LDL CALC: 74 mg/dL (ref 0–99)
NONHDL: 85.65
Total CHOL/HDL Ratio: 3
Triglycerides: 60 mg/dL (ref 0.0–149.0)
VLDL: 12 mg/dL (ref 0.0–40.0)

## 2017-10-22 LAB — TSH: TSH: 0.74 u[IU]/mL (ref 0.35–4.50)

## 2017-10-22 LAB — PSA: PSA: 0.78 ng/mL (ref 0.10–4.00)

## 2017-10-22 NOTE — Progress Notes (Signed)
Subjective:     Patient ID: Steven Pollard, male   DOB: 28-Dec-1955, 62 y.o.   MRN: [id number]  HPI Patient is here for physical exam. Chronic problems include history of hyperlipidemia, GERD, chronic insomnia, generalized anxiety.  Patient had low-dose CT lung cancer screening last year. No pulmonary nodules. He did have moderate coronary calcification. His dad died of coronary disease early to mid 48s. He had a maternal grandfather died of coronary disease early 26s. Patient has never had any recent chest pain. He's been somewhat less active physically since having back surgery several months ago. He would like to consider possibly further cardiac testing. He states he had a stress test back in the 1990s but none since then  Reported latex allergy but he has gotten flu shots for years without difficulty and is requesting flu shot today.  Diagnosed within the past year with alpha gal allergy. Avoiding red meats and has done well so far. Followed by allergist  Past Medical History:  Diagnosis Date  . Colon polyps   . GERD (gastroesophageal reflux disease)   . History of seizure disorder   . Hyperlipemia   . Prostate disease   . Wears glasses    Past Surgical History:  Procedure Laterality Date  . HERNIA REPAIR  1984   left  . perforated eardrum  1995  . [place]  . TONSILLECTOMY  1962    reports that he quit smoking about 11 years ago. His smoking use included cigarettes. He has a 34.00 pack-year smoking history. he has never used smokeless tobacco. He reports that he drinks alcohol. He reports that he does not use drugs. family history includes Arthritis in his mother; Heart disease (age of onset: 5) in his father. Allergies  Allergen Reactions  . Latex     Unknown   . Oxycodone-Acetaminophen     dizzines   . Tyloxapol Other (See Comments)    vertigo  . Caffeine Rash     Review of Systems  Constitutional: Negative for activity change, appetite change, fatigue  and fever.  HENT: Negative for congestion, ear pain and trouble swallowing.   Eyes: Negative for pain and visual disturbance.  Respiratory: Negative for cough, shortness of breath and wheezing.   Cardiovascular: Negative for chest pain and palpitations.  Gastrointestinal: Negative for abdominal distention, abdominal pain, blood in stool, constipation, diarrhea, nausea, rectal pain and vomiting.  Genitourinary: Negative for dysuria, hematuria and testicular pain.  Musculoskeletal: Negative for arthralgias and joint swelling.  Skin: Negative for rash.  Neurological: Negative for dizziness, syncope and headaches.  Hematological: Negative for adenopathy.  Psychiatric/Behavioral: Negative for confusion and dysphoric mood.       Objective:   Physical Exam  Constitutional: He is oriented to person, place, and time. He appears well-developed and well-nourished. No distress.  HENT:  Head: Normocephalic and atraumatic.  Right Ear: External ear normal.  Left Ear: External ear normal.  Mouth/Throat: Oropharynx is clear and moist.  Eyes: Conjunctivae and EOM are normal. Pupils are equal, round, and reactive to light.  Neck: Normal range of motion. Neck supple. No thyromegaly present.  Cardiovascular: Normal rate, regular rhythm and normal heart sounds.  No murmur heard. Pulmonary/Chest: No respiratory distress. He has no wheezes. He has no rales.  Abdominal: Soft. Bowel sounds are normal. He exhibits no distension and no mass. There is no tenderness. There is no rebound and no guarding.  Musculoskeletal: He exhibits no edema.  Lymphadenopathy:    He has  no cervical adenopathy.  Neurological: He is alert and oriented to person, place, and time. He displays normal reflexes. No cranial nerve deficit.  Skin: No rash noted.  Psychiatric: He has a normal mood and affect.       Assessment:     Physical exam. The following issues were addressed below. He had low-dose CT lung cancer screening with  moderate coronary artery calcification    Plan:     -Flu vaccine given -Obtain screening labs including hepatitis C antibody -Set up cardiology referral to discuss further cardiac screening given family history and CT findings above  [name] MD [facility] at [facility]

## 2017-10-23 LAB — HEPATITIS C ANTIBODY
Hepatitis C Ab: NONREACTIVE
SIGNAL TO CUT-OFF: 0.01 (ref <1.00)

## 2017-11-04 NOTE — Telephone Encounter (Signed)
error 

## 2017-12-14 ENCOUNTER — Encounter: Payer: Self-pay | Admitting: Family Medicine

## 2017-12-14 DIAGNOSIS — Z8249 Family history of ischemic heart disease and other diseases of the circulatory system: Secondary | ICD-10-CM

## 2018-01-01 ENCOUNTER — Encounter: Payer: Self-pay | Admitting: Family Medicine

## 2018-02-07 NOTE — Progress Notes (Signed)
Cardiology Office Note   Date:  02/09/2018   ID:  Steven Pollard, DOB 08/18/1956, MRN [id number]  PCP:  [name], MD  Cardiologist:   Dailee Manalang Martinique, MD   Chief Complaint  Patient presents with  . Coronary Artery Disease      History of Present Illness: Steven Pollard is a 62 y.o. male who is seen at the request of Dr. Elease Hashimoto for evaluation of an abnormal CT showing coronary calcification. He has a prior history of tobacco use but quit 11 years ago. He has treated hypercholesterolemia. He has a family history of CAD with father having an MI at age 22. He denies any prior cardiac problems. Saw Dr. Doreatha Lew 20 years ago and reports a normal stress test at that time. He is very active doing yard work, gardening, and Education officer, community. He denies any chest pain, SOB, palpitations, edema.    Past Medical History:  Diagnosis Date  . Colon polyps   . GERD (gastroesophageal reflux disease)   . History of seizure disorder   . Hyperlipemia   . Prostate disease   . Wears glasses     Past Surgical History:  Procedure Laterality Date  . HERNIA REPAIR  1984   left  . perforated eardrum  1995  . [place]  . TONSILLECTOMY  1962     Current Outpatient Medications  Medication Sig Dispense Refill  . aspirin (ASPIRIN LOW DOSE) 81 MG tablet Take 1 tablet by mouth daily.    Marland Kitchen augmented betamethasone dipropionate (DIPROLENE-AF) 0.05 % ointment Apply topically 2 (two) times daily.    . cetirizine (ZYRTEC) 10 MG tablet Take 10 mg by mouth daily.    Marland Kitchen EPINEPHrine (EPIPEN 2-PAK) 0.3 mg/0.3 mL IJ SOAJ injection Inject 0.3 mLs (0.3 mg total) into the muscle once as needed (difficulty breathing, hypotension, oral swelling). 1 Device 0  . omega-3 acid ethyl esters (LOVAZA) 1 g capsule TAKE 4 CAPSULES DAILY 360 capsule 1  . ranitidine (ZANTAC) 150 MG tablet TAKE 1 TABLET (150 MG TOTAL) BY MOUTH DAILY. 90 tablet 1  . rosuvastatin (CRESTOR) 10 MG tablet TAKE 1 TABLET BY MOUTH EVERY  DAY 90 tablet 1   No current facility-administered medications for this visit.     Allergies:   Latex; Oxycodone-acetaminophen; Tyloxapol; and Caffeine    Social History:  The patient  reports that he quit smoking about 11 years ago. His smoking use included cigarettes. He has a 34.00 pack-year smoking history. He has never used smokeless tobacco. He reports that he drinks alcohol. He reports that he does not use drugs.   Family History:  The patient's family history includes Arthritis in his mother; Heart disease (age of onset: 31) in his father.    ROS:  Please see the history of present illness.   Otherwise, review of systems are positive for none.   All other systems are reviewed and negative.    PHYSICAL EXAM: VS:  BP (!) 150/76 (BP Location: Left Arm)   Pulse 71   Ht 6\' 1"  (1.854 m)   Wt 202 lb 12.8 oz (92 kg)   BMI 26.76 kg/m  , BMI Body mass index is 26.76 kg/m. GEN: Well nourished, well developed, in no acute distress  HEENT: normal  Neck: no JVD, carotid bruits, or masses Cardiac: RRR; no murmurs, rubs, or gallops,no edema  Respiratory:  clear to auscultation bilaterally, normal work of breathing GI: soft, nontender, nondistended, + BS MS: no deformity  or atrophy  Skin: warm and dry, no rash Neuro:  Strength and sensation are intact Psych: euthymic mood, full affect   EKG:  EKG is ordered today. The ekg ordered today demonstrates NSR with normal Ecg. Rate 71. I have personally reviewed and interpreted this study.    Recent Labs: 10/22/2017: ALT 23; BUN 13; Creatinine, Ser 0.83; Hemoglobin 15.3; Platelets 174.0; Potassium 3.8; Sodium 141; TSH 0.74    Lipid Panel    Component Value Date/Time   CHOL 122 10/22/2017 0941   TRIG 60.0 10/22/2017 0941   HDL 36.00 (L) 10/22/2017 0941   CHOLHDL 3 10/22/2017 0941   VLDL 12.0 10/22/2017 0941   LDLCALC 74 10/22/2017 0941      Wt Readings from Last 3 Encounters:  02/09/18 202 lb 12.8 oz (92 kg)  10/22/17 204 lb  1.6 oz (92.6 kg)  05/04/17 195 lb (88.5 kg)      Other studies Reviewed: Additional studies/ records that were reviewed today include:   EXAM: CT CHEST WITHOUT CONTRAST LOW-DOSE FOR LUNG CANCER SCREENING  TECHNIQUE: Multidetector CT imaging of the chest was performed following the standard protocol without IV contrast.  COMPARISON:  06/04/2016.  FINDINGS: Cardiovascular: Minimal atherosclerotic calcification of the aorta. Moderate coronary artery calcification. Heart size normal. No pericardial effusion.  Mediastinum/Nodes: No pathologically enlarged mediastinal or axillary lymph nodes. Hilar regions are difficult to evaluate without IV contrast but appear grossly unremarkable. Esophagus is grossly unremarkable.  Lungs/Pleura: No worrisome pulmonary nodules. No pleural fluid. Airway is unremarkable.  Upper Abdomen: Visualized portions of the liver, gallbladder, adrenal glands, kidneys, spleen, pancreas, stomach and bowel are grossly unremarkable.  Musculoskeletal: Degenerative changes in the spine. No worrisome lytic or sclerotic lesions.  IMPRESSION: 1. Lung-RADS 1, negative. Continue annual screening with low-dose chest CT without contrast in 12 months. 2. Aortic atherosclerosis (ICD10-170.0). Moderate coronary artery calcification.   Electronically Signed   By: Lorin Picket M.D.   On: 09/01/2017 12:37   ASSESSMENT AND PLAN:  1.  Coronary calcification noted on CT. He is asymptomatic. Appears to have good control of risk factors with lipid lowering therapy and smoking cessation. We will arrange a ETT. If normal would continue risk factor modification.  2. Hypercholesterolemia. On statin therapy 3. Prior tobacco use. Quit.   Current medicines are reviewed at length with the patient today.  The patient does not have concerns regarding medicines.  The following changes have been made:  no change  Labs/ tests ordered today include:   Orders  Placed This Encounter  Procedures  . Exercise Tolerance Test  . EKG 12-Lead    Signed, Mayli Covington Martinique, MD  02/09/2018 11:08 AM    [facility] [address] [place], [place], [address] Phone [phone], Fax [phone]

## 2018-02-09 ENCOUNTER — Ambulatory Visit: Payer: 59 | Admitting: Cardiology

## 2018-02-09 ENCOUNTER — Encounter: Payer: Self-pay | Admitting: Cardiology

## 2018-02-09 ENCOUNTER — Telehealth (HOSPITAL_COMMUNITY): Payer: Self-pay

## 2018-02-09 VITALS — BP 134/80 | HR 71 | Ht 73.0 in | Wt 202.8 lb

## 2018-02-09 DIAGNOSIS — I2584 Coronary atherosclerosis due to calcified coronary lesion: Secondary | ICD-10-CM

## 2018-02-09 DIAGNOSIS — E7800 Pure hypercholesterolemia, unspecified: Secondary | ICD-10-CM | POA: Diagnosis not present

## 2018-02-09 DIAGNOSIS — I2510 Atherosclerotic heart disease of native coronary artery without angina pectoris: Secondary | ICD-10-CM

## 2018-02-09 NOTE — Telephone Encounter (Signed)
Encounter complete. 

## 2018-02-09 NOTE — Patient Instructions (Signed)
We will schedule you for a stress test  

## 2018-02-11 ENCOUNTER — Ambulatory Visit (HOSPITAL_COMMUNITY)
Admission: RE | Admit: 2018-02-11 | Discharge: 2018-02-11 | Disposition: A | Discharge status: Home / Self Care | Payer: 59 | Source: Ambulatory Visit | Attending: Cardiovascular Disease | Admitting: Cardiovascular Disease

## 2018-02-11 DIAGNOSIS — I2584 Coronary atherosclerosis due to calcified coronary lesion: Secondary | ICD-10-CM | POA: Diagnosis not present

## 2018-02-11 DIAGNOSIS — E7800 Pure hypercholesterolemia, unspecified: Secondary | ICD-10-CM | POA: Diagnosis not present

## 2018-02-11 DIAGNOSIS — I2510 Atherosclerotic heart disease of native coronary artery without angina pectoris: Secondary | ICD-10-CM

## 2018-02-11 LAB — EXERCISE TOLERANCE TEST
CSEPED: 10 min
CSEPEDS: 37 s
CSEPEW: 13 METS
CSEPHR: 105 %
CSEPPHR: 164 {beats}/min
MPHR: 156 {beats}/min
RPE: 17
Rest HR: 61 {beats}/min

## 2018-02-23 ENCOUNTER — Telehealth: Payer: Self-pay

## 2018-02-23 NOTE — Telephone Encounter (Signed)
Left message on personal voice mail Dr.Jordan advised follow up with PCP.Call if needed.

## 2018-05-20 ENCOUNTER — Other Ambulatory Visit: Payer: Self-pay | Admitting: Family Medicine

## 2018-06-08 DIAGNOSIS — M5416 Radiculopathy, lumbar region: Secondary | ICD-10-CM | POA: Diagnosis not present

## 2018-06-08 DIAGNOSIS — R030 Elevated blood-pressure reading, without diagnosis of hypertension: Secondary | ICD-10-CM | POA: Diagnosis not present

## 2018-07-01 DIAGNOSIS — M5416 Radiculopathy, lumbar region: Secondary | ICD-10-CM | POA: Diagnosis not present

## 2018-07-05 DIAGNOSIS — M48061 Spinal stenosis, lumbar region without neurogenic claudication: Secondary | ICD-10-CM | POA: Diagnosis not present

## 2018-07-05 DIAGNOSIS — M5416 Radiculopathy, lumbar region: Secondary | ICD-10-CM | POA: Diagnosis not present

## 2018-07-05 DIAGNOSIS — M5126 Other intervertebral disc displacement, lumbar region: Secondary | ICD-10-CM | POA: Diagnosis not present

## 2018-07-06 DIAGNOSIS — R030 Elevated blood-pressure reading, without diagnosis of hypertension: Secondary | ICD-10-CM | POA: Diagnosis not present

## 2018-07-06 DIAGNOSIS — M5126 Other intervertebral disc displacement, lumbar region: Secondary | ICD-10-CM | POA: Diagnosis not present

## 2018-07-06 DIAGNOSIS — Z6828 Body mass index (BMI) 28.0-28.9, adult: Secondary | ICD-10-CM | POA: Diagnosis not present

## 2018-07-22 DIAGNOSIS — M5116 Intervertebral disc disorders with radiculopathy, lumbar region: Secondary | ICD-10-CM | POA: Diagnosis not present

## 2018-07-22 DIAGNOSIS — M5416 Radiculopathy, lumbar region: Secondary | ICD-10-CM | POA: Diagnosis not present

## 2018-09-02 ENCOUNTER — Ambulatory Visit
Admission: RE | Admit: 2018-09-02 | Discharge: 2018-09-02 | Disposition: A | Discharge status: Home / Self Care | Payer: 59 | Source: Ambulatory Visit | Attending: Acute Care | Admitting: Acute Care

## 2018-09-02 DIAGNOSIS — Z87891 Personal history of nicotine dependence: Secondary | ICD-10-CM | POA: Diagnosis not present

## 2018-09-02 DIAGNOSIS — Z122 Encounter for screening for malignant neoplasm of respiratory organs: Secondary | ICD-10-CM

## 2018-09-05 ENCOUNTER — Telehealth: Payer: Self-pay | Admitting: Acute Care

## 2018-09-05 DIAGNOSIS — Z87891 Personal history of nicotine dependence: Secondary | ICD-10-CM

## 2018-09-05 DIAGNOSIS — Z122 Encounter for screening for malignant neoplasm of respiratory organs: Secondary | ICD-10-CM

## 2018-09-05 NOTE — Telephone Encounter (Signed)
Pt informed of CT results per Sarah Groce, NP.  PT verbalized understanding.  Copy sent to PCP.  Order placed for 1 yr f/u CT.  

## 2018-09-07 ENCOUNTER — Other Ambulatory Visit: Payer: Self-pay | Admitting: Family Medicine

## 2018-09-07 NOTE — Telephone Encounter (Signed)
He has taken in past.  May refill Celebrex 200 mg po qd #30 with 2 refills.

## 2018-09-07 NOTE — Telephone Encounter (Signed)
Rx done. 

## 2018-09-20 DIAGNOSIS — M545 Low back pain: Secondary | ICD-10-CM | POA: Diagnosis not present

## 2018-09-20 DIAGNOSIS — M5416 Radiculopathy, lumbar region: Secondary | ICD-10-CM | POA: Diagnosis not present

## 2018-09-20 DIAGNOSIS — M5126 Other intervertebral disc displacement, lumbar region: Secondary | ICD-10-CM | POA: Diagnosis not present

## 2018-09-23 DIAGNOSIS — M5416 Radiculopathy, lumbar region: Secondary | ICD-10-CM | POA: Diagnosis not present

## 2018-09-23 DIAGNOSIS — M545 Low back pain: Secondary | ICD-10-CM | POA: Diagnosis not present

## 2018-09-23 DIAGNOSIS — M5126 Other intervertebral disc displacement, lumbar region: Secondary | ICD-10-CM | POA: Diagnosis not present

## 2018-09-26 DIAGNOSIS — M545 Low back pain: Secondary | ICD-10-CM | POA: Diagnosis not present

## 2018-09-26 DIAGNOSIS — M5126 Other intervertebral disc displacement, lumbar region: Secondary | ICD-10-CM | POA: Diagnosis not present

## 2018-09-26 DIAGNOSIS — M5416 Radiculopathy, lumbar region: Secondary | ICD-10-CM | POA: Diagnosis not present

## 2018-09-28 DIAGNOSIS — M545 Low back pain: Secondary | ICD-10-CM | POA: Diagnosis not present

## 2018-09-28 DIAGNOSIS — M5416 Radiculopathy, lumbar region: Secondary | ICD-10-CM | POA: Diagnosis not present

## 2018-09-28 DIAGNOSIS — M5126 Other intervertebral disc displacement, lumbar region: Secondary | ICD-10-CM | POA: Diagnosis not present

## 2018-09-29 DIAGNOSIS — L218 Other seborrheic dermatitis: Secondary | ICD-10-CM | POA: Diagnosis not present

## 2018-09-29 DIAGNOSIS — L821 Other seborrheic keratosis: Secondary | ICD-10-CM | POA: Diagnosis not present

## 2018-09-29 DIAGNOSIS — L812 Freckles: Secondary | ICD-10-CM | POA: Diagnosis not present

## 2018-09-29 DIAGNOSIS — L820 Inflamed seborrheic keratosis: Secondary | ICD-10-CM | POA: Diagnosis not present

## 2018-10-10 DIAGNOSIS — M5126 Other intervertebral disc displacement, lumbar region: Secondary | ICD-10-CM | POA: Diagnosis not present

## 2018-10-10 DIAGNOSIS — M545 Low back pain: Secondary | ICD-10-CM | POA: Diagnosis not present

## 2018-10-10 DIAGNOSIS — M5416 Radiculopathy, lumbar region: Secondary | ICD-10-CM | POA: Diagnosis not present

## 2018-10-17 DIAGNOSIS — M5416 Radiculopathy, lumbar region: Secondary | ICD-10-CM | POA: Diagnosis not present

## 2018-10-17 DIAGNOSIS — M5126 Other intervertebral disc displacement, lumbar region: Secondary | ICD-10-CM | POA: Diagnosis not present

## 2018-10-17 DIAGNOSIS — M545 Low back pain: Secondary | ICD-10-CM | POA: Diagnosis not present

## 2018-10-17 DIAGNOSIS — M2550 Pain in unspecified joint: Secondary | ICD-10-CM | POA: Diagnosis not present

## 2018-10-20 DIAGNOSIS — M545 Low back pain: Secondary | ICD-10-CM | POA: Diagnosis not present

## 2018-10-20 DIAGNOSIS — M5126 Other intervertebral disc displacement, lumbar region: Secondary | ICD-10-CM | POA: Diagnosis not present

## 2018-10-20 DIAGNOSIS — M5416 Radiculopathy, lumbar region: Secondary | ICD-10-CM | POA: Diagnosis not present

## 2018-10-20 DIAGNOSIS — M2550 Pain in unspecified joint: Secondary | ICD-10-CM | POA: Diagnosis not present

## 2018-10-24 DIAGNOSIS — M545 Low back pain: Secondary | ICD-10-CM | POA: Diagnosis not present

## 2018-10-24 DIAGNOSIS — M5416 Radiculopathy, lumbar region: Secondary | ICD-10-CM | POA: Diagnosis not present

## 2018-10-24 DIAGNOSIS — M5126 Other intervertebral disc displacement, lumbar region: Secondary | ICD-10-CM | POA: Diagnosis not present

## 2018-10-24 DIAGNOSIS — M2550 Pain in unspecified joint: Secondary | ICD-10-CM | POA: Diagnosis not present

## 2018-10-25 ENCOUNTER — Other Ambulatory Visit: Payer: Self-pay

## 2018-10-25 ENCOUNTER — Ambulatory Visit (INDEPENDENT_AMBULATORY_CARE_PROVIDER_SITE_OTHER): Payer: BLUE CROSS/BLUE SHIELD | Admitting: Family Medicine

## 2018-10-25 ENCOUNTER — Encounter: Payer: Self-pay | Admitting: Family Medicine

## 2018-10-25 VITALS — BP 130/82 | HR 75 | Temp 97.3°F | Ht 71.75 in | Wt 195.9 lb

## 2018-10-25 DIAGNOSIS — Z23 Encounter for immunization: Secondary | ICD-10-CM | POA: Diagnosis not present

## 2018-10-25 DIAGNOSIS — Z125 Encounter for screening for malignant neoplasm of prostate: Secondary | ICD-10-CM

## 2018-10-25 DIAGNOSIS — Z91018 Allergy to other foods: Secondary | ICD-10-CM

## 2018-10-25 DIAGNOSIS — Z0000 Encounter for general adult medical examination without abnormal findings: Secondary | ICD-10-CM

## 2018-10-25 LAB — CBC WITH DIFFERENTIAL/PLATELET
BASOS ABS: 0 10*3/uL (ref 0.0–0.1)
Basophils Relative: 0.4 % (ref 0.0–3.0)
Eosinophils Absolute: 0 10*3/uL (ref 0.0–0.7)
Eosinophils Relative: 0.5 % (ref 0.0–5.0)
HEMATOCRIT: 46.1 % (ref 39.0–52.0)
Hemoglobin: 16 g/dL (ref 13.0–17.0)
Lymphocytes Relative: 33.3 % (ref 12.0–46.0)
Lymphs Abs: 1.7 10*3/uL (ref 0.7–4.0)
MCHC: 34.6 g/dL (ref 30.0–36.0)
MCV: 96.7 fl (ref 78.0–100.0)
MONOS PCT: 10.3 % (ref 3.0–12.0)
Monocytes Absolute: 0.5 10*3/uL (ref 0.1–1.0)
Neutro Abs: 2.8 10*3/uL (ref 1.4–7.7)
Neutrophils Relative %: 55.5 % (ref 43.0–77.0)
Platelets: 207 10*3/uL (ref 150.0–400.0)
RBC: 4.77 Mil/uL (ref 4.22–5.81)
RDW: 13.4 % (ref 11.5–15.5)
WBC: 5 10*3/uL (ref 4.0–10.5)

## 2018-10-25 LAB — HEPATIC FUNCTION PANEL
ALK PHOS: 58 U/L (ref 39–117)
ALT: 22 U/L (ref 0–53)
AST: 15 U/L (ref 0–37)
Albumin: 4.6 g/dL (ref 3.5–5.2)
BILIRUBIN DIRECT: 0.2 mg/dL (ref 0.0–0.3)
TOTAL PROTEIN: 6.7 g/dL (ref 6.0–8.3)
Total Bilirubin: 1 mg/dL (ref 0.2–1.2)

## 2018-10-25 LAB — BASIC METABOLIC PANEL
BUN: 7 mg/dL (ref 6–23)
CALCIUM: 10.1 mg/dL (ref 8.4–10.5)
CO2: 32 meq/L (ref 19–32)
CREATININE: 0.86 mg/dL (ref 0.40–1.50)
Chloride: 102 mEq/L (ref 96–112)
GFR: 94.9 mL/min (ref >60.00)
Glucose, Bld: 95 mg/dL (ref 70–99)
Potassium: 4.5 mEq/L (ref 3.5–5.1)
Sodium: 142 mEq/L (ref 135–145)

## 2018-10-25 LAB — LIPID PANEL
CHOL/HDL RATIO: 4
Cholesterol: 142 mg/dL (ref 0–200)
HDL: 36.7 mg/dL — ABNORMAL LOW (ref >39.00)
LDL Cholesterol: 77 mg/dL (ref 0–99)
NonHDL: 105.28
Triglycerides: 142 mg/dL (ref 0.0–149.0)
VLDL: 28.4 mg/dL (ref 0.0–40.0)

## 2018-10-25 LAB — PSA: PSA: 1.22 ng/mL (ref 0.10–4.00)

## 2018-10-25 LAB — TSH: TSH: 1.12 u[IU]/mL (ref 0.35–4.50)

## 2018-10-25 NOTE — Progress Notes (Signed)
Subjective:     Patient ID: Steven Pollard, male   DOB: May 17, 1956, 63 y.o.   MRN: [id number]  HPI Patient is seen for physical exam.  He has had ongoing back issues during the past year and is getting physical therapy and back is slowly improving.  He has hyperlipidemia and is on Crestor.  Health maintenance reviewed.  Needs flu shot still.  He had Zostavax back in 2016.  Will need tetanus booster next year.    Will be due for colonoscopy by next year.  He plans to set that up on his own.  He is requesting alpha gal IgE level.  He was diagnosed with alpha gal allergy couple years ago.  Generally avoids red meat.  No recurrent or recent hives.  Past Medical History:  Diagnosis Date  . Colon polyps   . GERD (gastroesophageal reflux disease)   . History of seizure disorder   . Hyperlipemia   . Prostate disease   . Wears glasses    Past Surgical History:  Procedure Laterality Date  . BACK SURGERY  2018  . HERNIA REPAIR  1984   left  . perforated eardrum  1995  . [place]  . TONSILLECTOMY  1962    reports that he quit smoking about 12 years ago. His smoking use included cigarettes. He has a 34.00 pack-year smoking history. He has never used smokeless tobacco. He reports current alcohol use. He reports that he does not use drugs. family history includes Arthritis in his mother; Heart disease (age of onset: 21) in his father. Allergies  Allergen Reactions  . Latex     Unknown   . Oxycodone-Acetaminophen     dizzines   . Tyloxapol Other (See Comments)    vertigo  . Caffeine Rash      Review of Systems  Constitutional: Negative for activity change, appetite change, fatigue and fever.  HENT: Negative for congestion, ear pain and trouble swallowing.   Eyes: Negative for pain and visual disturbance.  Respiratory: Negative for cough, shortness of breath and wheezing.   Cardiovascular: Negative for chest pain and palpitations.  Gastrointestinal: Negative for abdominal  distention, abdominal pain, blood in stool, constipation, diarrhea, nausea, rectal pain and vomiting.  Endocrine: Negative for polydipsia and polyuria.  Genitourinary: Negative for dysuria, hematuria and testicular pain.  Musculoskeletal: Positive for back pain. Negative for arthralgias and joint swelling.  Skin: Negative for rash.  Neurological: Negative for dizziness, syncope and headaches.  Hematological: Negative for adenopathy.  Psychiatric/Behavioral: Negative for confusion and dysphoric mood.       Objective:   Physical Exam Constitutional:      General: He is not in acute distress.    Appearance: He is well-developed.  HENT:     Head: Normocephalic and atraumatic.     Left Ear: External ear normal.     Ears:     Comments: He has erythema and distortion of the right eardrum which is chronic.  Ear canal itself appears normal Eyes:     Conjunctiva/sclera: Conjunctivae normal.     Pupils: Pupils are equal, round, and reactive to light.  Neck:     Musculoskeletal: Normal range of motion and neck supple.     Thyroid: No thyromegaly.  Cardiovascular:     Rate and Rhythm: Normal rate and regular rhythm.     Heart sounds: Normal heart sounds. No murmur.  Pulmonary:     Effort: No respiratory distress.     Breath sounds: No  wheezing or rales.  Abdominal:     General: Bowel sounds are normal. There is no distension.     Palpations: Abdomen is soft. There is no mass.     Tenderness: There is no abdominal tenderness. There is no guarding or rebound.  Lymphadenopathy:     Cervical: No cervical adenopathy.  Skin:    Findings: No rash.  Neurological:     Mental Status: He is alert and oriented to person, place, and time.     Cranial Nerves: No cranial nerve deficit.     Deep Tendon Reflexes: Reflexes normal.        Assessment:     Physical exam.  We discussed the following health maintenance issues    Plan:     -Flu vaccine given -We will need tetanus booster by next  year -Set up screening labs including PSA.  Patient also requesting galactose alpha-1, 3, galactose IgE level -We will need repeat colonoscopy by next year  [name] MD [facility] at [facility]

## 2018-10-28 DIAGNOSIS — M5126 Other intervertebral disc displacement, lumbar region: Secondary | ICD-10-CM | POA: Diagnosis not present

## 2018-10-28 DIAGNOSIS — M5416 Radiculopathy, lumbar region: Secondary | ICD-10-CM | POA: Diagnosis not present

## 2018-10-28 DIAGNOSIS — M545 Low back pain: Secondary | ICD-10-CM | POA: Diagnosis not present

## 2018-10-28 DIAGNOSIS — M2550 Pain in unspecified joint: Secondary | ICD-10-CM | POA: Diagnosis not present

## 2018-10-31 DIAGNOSIS — M5416 Radiculopathy, lumbar region: Secondary | ICD-10-CM | POA: Diagnosis not present

## 2018-10-31 DIAGNOSIS — M5126 Other intervertebral disc displacement, lumbar region: Secondary | ICD-10-CM | POA: Diagnosis not present

## 2018-10-31 DIAGNOSIS — M545 Low back pain: Secondary | ICD-10-CM | POA: Diagnosis not present

## 2018-10-31 DIAGNOSIS — M2550 Pain in unspecified joint: Secondary | ICD-10-CM | POA: Diagnosis not present

## 2018-10-31 LAB — GALACTOSE-ALPHA-1,3-GALACTOSE IGE: GALACTOSE-ALPHA-1, 3-GALACTOSE IGE: 1.49 kU/L — AB (ref <0.10)

## 2018-11-03 DIAGNOSIS — M2550 Pain in unspecified joint: Secondary | ICD-10-CM | POA: Diagnosis not present

## 2018-11-03 DIAGNOSIS — M5126 Other intervertebral disc displacement, lumbar region: Secondary | ICD-10-CM | POA: Diagnosis not present

## 2018-11-03 DIAGNOSIS — M5416 Radiculopathy, lumbar region: Secondary | ICD-10-CM | POA: Diagnosis not present

## 2018-11-03 DIAGNOSIS — M545 Low back pain: Secondary | ICD-10-CM | POA: Diagnosis not present

## 2018-11-07 DIAGNOSIS — M2550 Pain in unspecified joint: Secondary | ICD-10-CM | POA: Diagnosis not present

## 2018-11-07 DIAGNOSIS — M545 Low back pain: Secondary | ICD-10-CM | POA: Diagnosis not present

## 2018-11-07 DIAGNOSIS — M5416 Radiculopathy, lumbar region: Secondary | ICD-10-CM | POA: Diagnosis not present

## 2018-11-07 DIAGNOSIS — M5126 Other intervertebral disc displacement, lumbar region: Secondary | ICD-10-CM | POA: Diagnosis not present

## 2018-11-10 DIAGNOSIS — M2550 Pain in unspecified joint: Secondary | ICD-10-CM | POA: Diagnosis not present

## 2018-11-10 DIAGNOSIS — M545 Low back pain: Secondary | ICD-10-CM | POA: Diagnosis not present

## 2018-11-10 DIAGNOSIS — M5416 Radiculopathy, lumbar region: Secondary | ICD-10-CM | POA: Diagnosis not present

## 2018-11-10 DIAGNOSIS — M5126 Other intervertebral disc displacement, lumbar region: Secondary | ICD-10-CM | POA: Diagnosis not present

## 2018-11-16 DIAGNOSIS — M5126 Other intervertebral disc displacement, lumbar region: Secondary | ICD-10-CM | POA: Diagnosis not present

## 2018-11-16 DIAGNOSIS — M545 Low back pain: Secondary | ICD-10-CM | POA: Diagnosis not present

## 2018-11-16 DIAGNOSIS — M5416 Radiculopathy, lumbar region: Secondary | ICD-10-CM | POA: Diagnosis not present

## 2018-11-16 DIAGNOSIS — M2550 Pain in unspecified joint: Secondary | ICD-10-CM | POA: Diagnosis not present

## 2018-11-18 ENCOUNTER — Other Ambulatory Visit: Payer: Self-pay | Admitting: Family Medicine

## 2018-11-28 IMAGING — CT CT CHEST LUNG CANCER SCREENING LOW DOSE W/O CM
2 of 5 series · 15 of 40 positions shown, 18 images · non-contrast
Comparison: Low-dose lung cancer screening chest CT 09/01/2017.

CLINICAL DATA: 62-year-old male former smoker (quit 11 years ago)
with 34 pack-year history of smoking. Lung cancer screening
examination.

EXAM:
CT CHEST WITHOUT CONTRAST LOW-DOSE FOR LUNG CANCER SCREENING
TECHNIQUE: Multidetector CT imaging of the chest was performed following the
standard protocol without IV contrast.

[Series 4: lung 1.00 br44 cor · coronal · 0.68mm/px · 3 of 308 slices shown]
[im 62/308  lung]
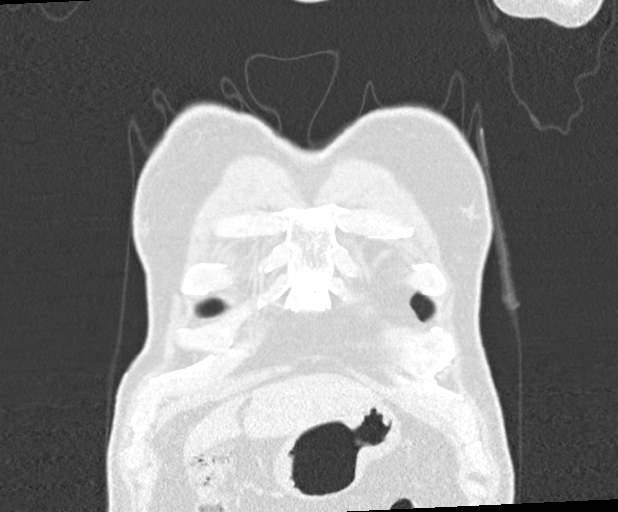
[im 123/308  lung]
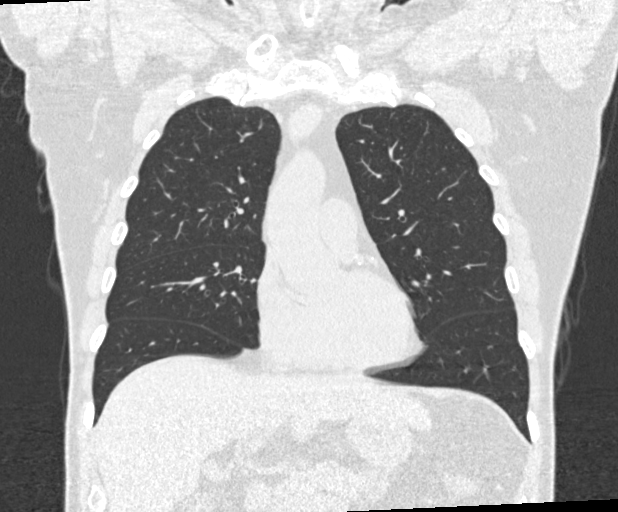
[im 185/308  lung]
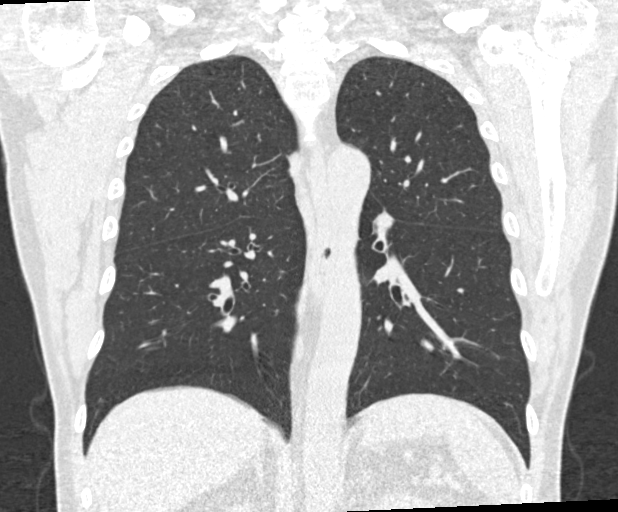

[Series 9: lung 1.00 br60 · axial · 0.73mm/px · z∈[-1329,-1016]mm · 12 of 351 slices shown, 15 images]
[im 19/351  mediastinal]
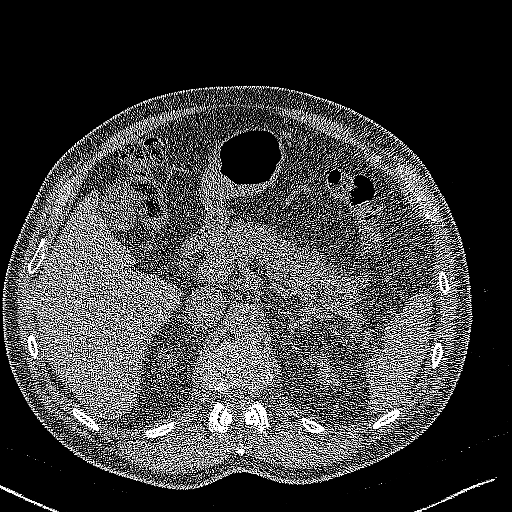
[im 19/351  lung]
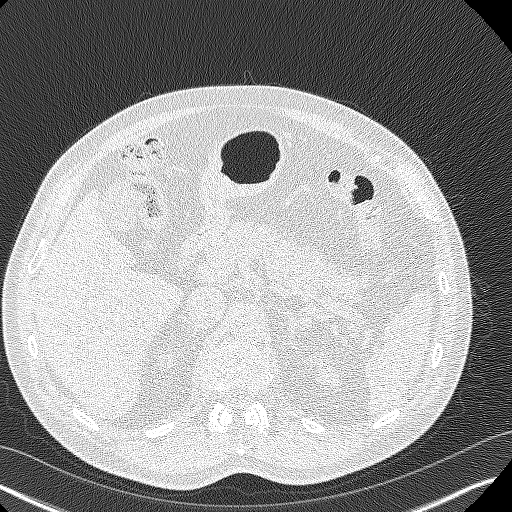
[im 56/351  lung]
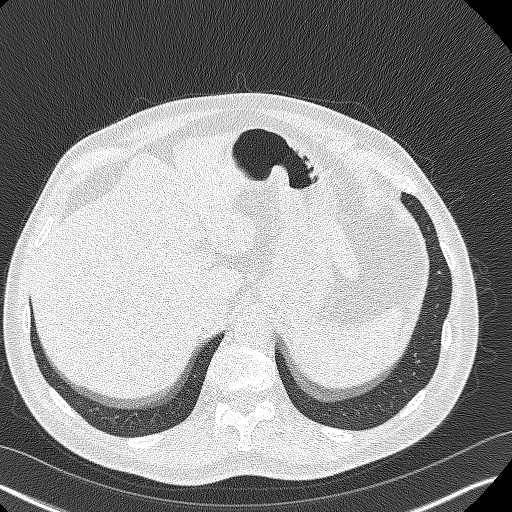
[im 74/351  lung]
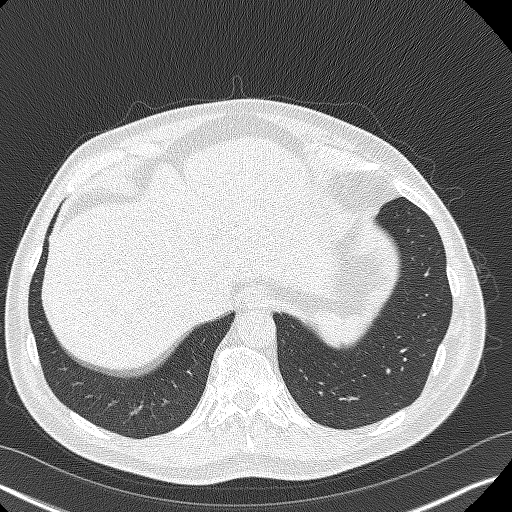
[im 111/351  lung]
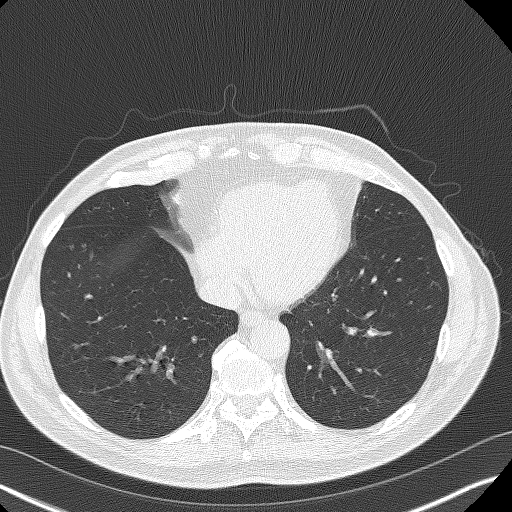
[im 129/351  mediastinal]
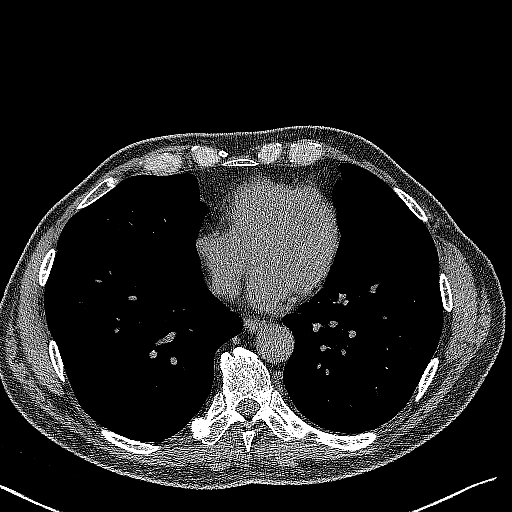
[im 129/351  lung]
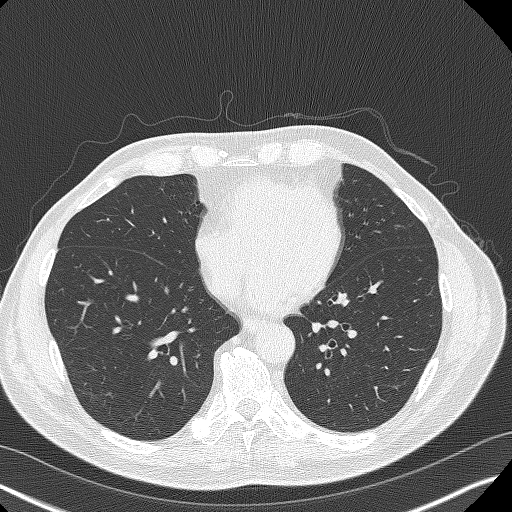
[im 166/351  lung]
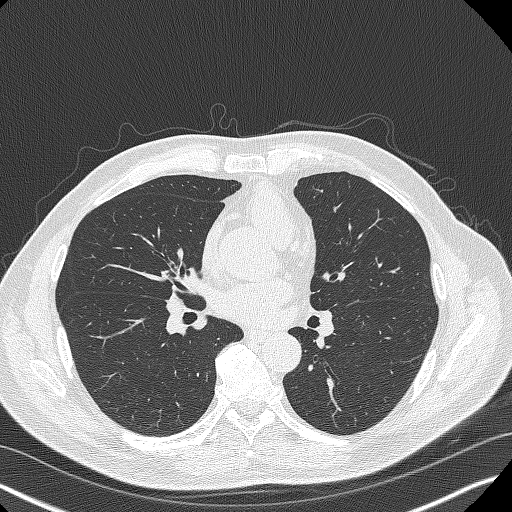
[im 185/351  lung]
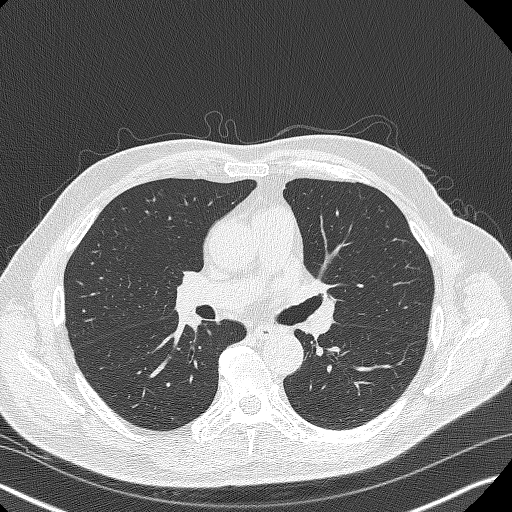
[im 222/351  lung]
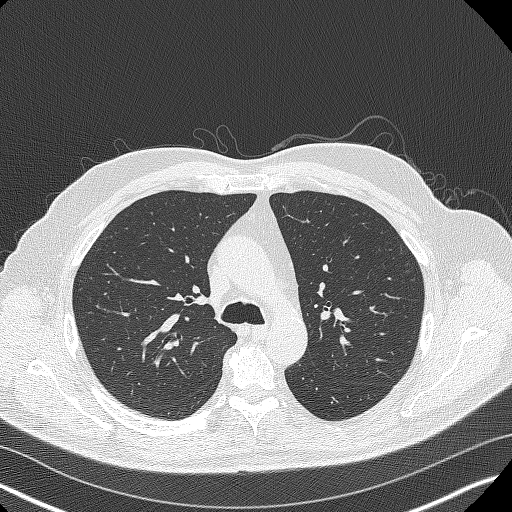
[im 240/351  mediastinal]
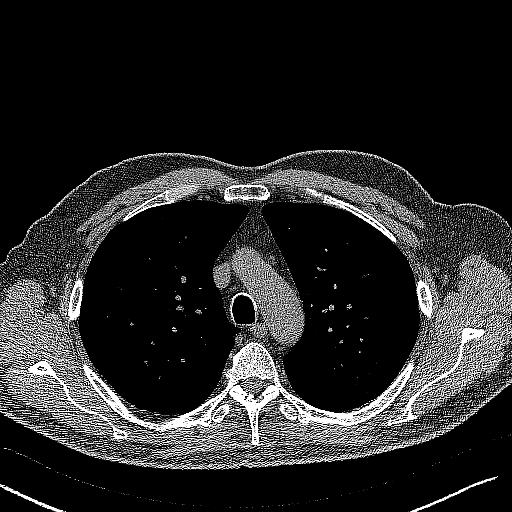
[im 240/351  lung]
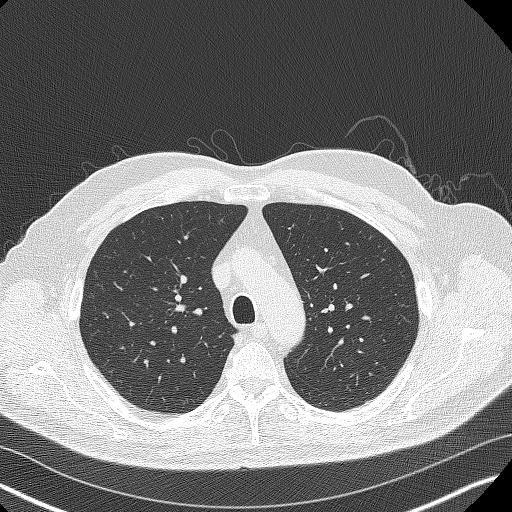
[im 277/351  lung]
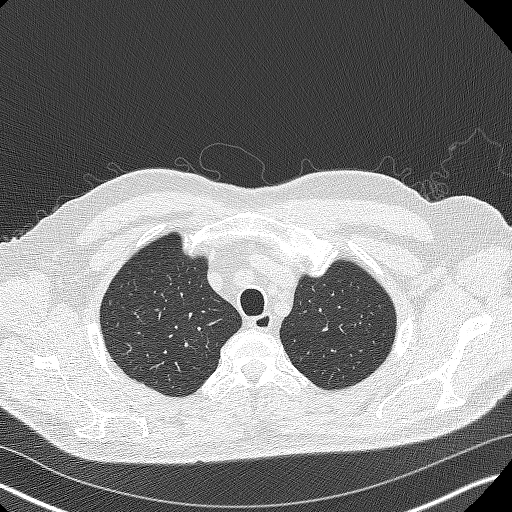
[im 295/351  lung]
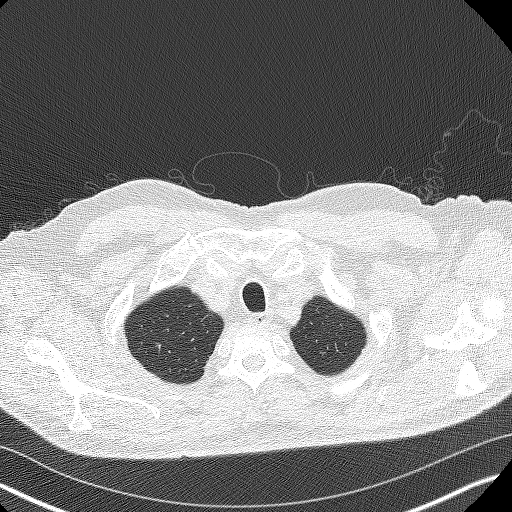
[im 332/351  lung]
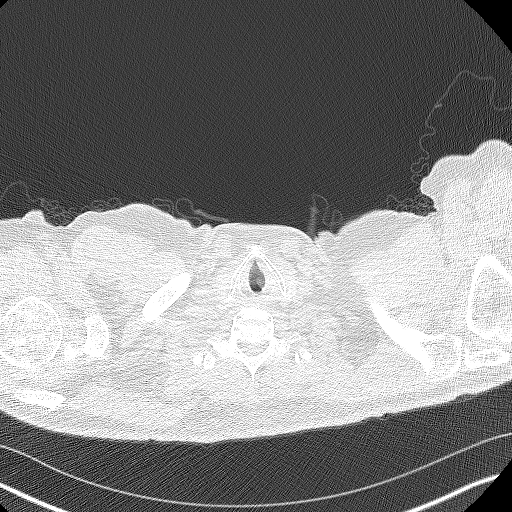

[15 of 40 positions shown; findings below may reference images not displayed]

FINDINGS: Cardiovascular: Heart size is normal. There is no significant
pericardial fluid, thickening or pericardial calcification. There is
aortic atherosclerosis, as well as atherosclerosis of the great
vessels of the mediastinum and the coronary arteries, including
calcified atherosclerotic plaque in the left main, left anterior
descending, left circumflex and right coronary arteries.

Mediastinum/Nodes: No pathologically enlarged mediastinal or hilar
lymph nodes. Please note that accurate exclusion of hilar adenopathy
is limited on noncontrast CT scans. Esophagus is unremarkable in
appearance. No axillary lymphadenopathy.

Lungs/Pleura: No suspicious appearing pulmonary nodules or masses
are noted. No acute consolidative airspace disease. No pleural
effusions. Diffuse bronchial wall thickening with mild centrilobular
and paraseptal emphysema.

Upper Abdomen: Unremarkable.

Musculoskeletal: There are no aggressive appearing lytic or blastic
lesions noted in the visualized portions of the skeleton.
IMPRESSION: 1. Lung-RADS 1S, negative. Continue annual screening with low-dose
chest CT without contrast in 12 months.
2. The "S" modifier above refers to potentially clinically
significant non lung cancer related findings. Specifically, there is
aortic atherosclerosis, in addition to left main and 3 vessel
coronary artery disease. Please note that although the presence of
coronary artery calcium documents the presence of coronary artery
disease, the severity of this disease and any potential stenosis
cannot be assessed on this non-gated CT examination. Assessment for
potential risk factor modification, dietary therapy or pharmacologic
therapy may be warranted, if clinically indicated.
3. Mild diffuse bronchial thickening with mild centrilobular and
paraseptal emphysema; imaging findings suggestive of underlying
COPD.

Aortic Atherosclerosis (ULRZZ-9LX.X) and Emphysema ([id number].N).

## 2019-02-07 DIAGNOSIS — L820 Inflamed seborrheic keratosis: Secondary | ICD-10-CM | POA: Diagnosis not present

## 2019-02-07 DIAGNOSIS — S20462A Insect bite (nonvenomous) of left back wall of thorax, initial encounter: Secondary | ICD-10-CM | POA: Diagnosis not present

## 2019-05-24 ENCOUNTER — Other Ambulatory Visit: Payer: Self-pay | Admitting: Family Medicine

## 2019-08-29 ENCOUNTER — Ambulatory Visit (INDEPENDENT_AMBULATORY_CARE_PROVIDER_SITE_OTHER): Payer: Medicare HMO | Admitting: *Deleted

## 2019-08-29 ENCOUNTER — Other Ambulatory Visit: Payer: Self-pay

## 2019-08-29 DIAGNOSIS — Z23 Encounter for immunization: Secondary | ICD-10-CM | POA: Diagnosis not present

## 2019-09-15 ENCOUNTER — Ambulatory Visit
Admission: RE | Admit: 2019-09-15 | Discharge: 2019-09-15 | Disposition: A | Discharge status: Home / Self Care | Payer: Medicare HMO | Source: Ambulatory Visit | Attending: Acute Care | Admitting: Acute Care

## 2019-09-15 DIAGNOSIS — Z87891 Personal history of nicotine dependence: Secondary | ICD-10-CM | POA: Diagnosis not present

## 2019-09-15 DIAGNOSIS — Z122 Encounter for screening for malignant neoplasm of respiratory organs: Secondary | ICD-10-CM

## 2019-09-20 ENCOUNTER — Other Ambulatory Visit: Payer: Self-pay | Admitting: *Deleted

## 2019-09-20 DIAGNOSIS — Z87891 Personal history of nicotine dependence: Secondary | ICD-10-CM

## 2019-09-20 DIAGNOSIS — Z122 Encounter for screening for malignant neoplasm of respiratory organs: Secondary | ICD-10-CM

## 2019-09-22 ENCOUNTER — Encounter: Payer: Self-pay | Admitting: Family Medicine

## 2019-09-25 ENCOUNTER — Other Ambulatory Visit: Payer: Self-pay | Admitting: Family Medicine

## 2019-10-01 ENCOUNTER — Other Ambulatory Visit: Payer: Self-pay | Admitting: Family Medicine

## 2019-10-02 DIAGNOSIS — L218 Other seborrheic dermatitis: Secondary | ICD-10-CM | POA: Diagnosis not present

## 2019-10-02 DIAGNOSIS — L821 Other seborrheic keratosis: Secondary | ICD-10-CM | POA: Diagnosis not present

## 2019-10-02 DIAGNOSIS — L820 Inflamed seborrheic keratosis: Secondary | ICD-10-CM | POA: Diagnosis not present

## 2019-10-02 DIAGNOSIS — D1801 Hemangioma of skin and subcutaneous tissue: Secondary | ICD-10-CM | POA: Diagnosis not present

## 2019-10-24 ENCOUNTER — Other Ambulatory Visit: Payer: Self-pay

## 2019-10-25 ENCOUNTER — Encounter: Payer: Self-pay | Admitting: Family Medicine

## 2019-10-25 ENCOUNTER — Other Ambulatory Visit: Payer: Self-pay

## 2019-10-25 ENCOUNTER — Ambulatory Visit (INDEPENDENT_AMBULATORY_CARE_PROVIDER_SITE_OTHER): Payer: Medicare HMO | Admitting: Family Medicine

## 2019-10-25 VITALS — BP 124/80 | HR 78 | Temp 97.8°F | Ht 70.5 in | Wt 200.0 lb

## 2019-10-25 DIAGNOSIS — Z23 Encounter for immunization: Secondary | ICD-10-CM

## 2019-10-25 DIAGNOSIS — Z0000 Encounter for general adult medical examination without abnormal findings: Secondary | ICD-10-CM

## 2019-10-25 DIAGNOSIS — Z125 Encounter for screening for malignant neoplasm of prostate: Secondary | ICD-10-CM | POA: Diagnosis not present

## 2019-10-25 DIAGNOSIS — T781XXA Other adverse food reactions, not elsewhere classified, initial encounter: Secondary | ICD-10-CM | POA: Diagnosis not present

## 2019-10-25 DIAGNOSIS — E785 Hyperlipidemia, unspecified: Secondary | ICD-10-CM

## 2019-10-25 LAB — LIPID PANEL
Cholesterol: 132 mg/dL (ref 0–200)
HDL: 44.6 mg/dL (ref >39.00)
LDL Cholesterol: 73 mg/dL (ref 0–99)
NonHDL: 87.39
Total CHOL/HDL Ratio: 3
Triglycerides: 72 mg/dL (ref 0.0–149.0)
VLDL: 14.4 mg/dL (ref 0.0–40.0)

## 2019-10-25 LAB — HEPATIC FUNCTION PANEL
ALT: 23 U/L (ref 0–53)
AST: 18 U/L (ref 0–37)
Albumin: 4.7 g/dL (ref 3.5–5.2)
Alkaline Phosphatase: 68 U/L (ref 39–117)
Bilirubin, Direct: 0.2 mg/dL (ref 0.0–0.3)
Total Bilirubin: 1 mg/dL (ref 0.2–1.2)
Total Protein: 6.7 g/dL (ref 6.0–8.3)

## 2019-10-25 LAB — BASIC METABOLIC PANEL
BUN: 11 mg/dL (ref 6–23)
CO2: 30 mEq/L (ref 19–32)
Calcium: 9.9 mg/dL (ref 8.4–10.5)
Chloride: 104 mEq/L (ref 96–112)
Creatinine, Ser: 0.95 mg/dL (ref 0.40–1.50)
GFR: 79.35 mL/min (ref >60.00)
Glucose, Bld: 109 mg/dL — ABNORMAL HIGH (ref 70–99)
Potassium: 4.3 mEq/L (ref 3.5–5.1)
Sodium: 142 mEq/L (ref 135–145)

## 2019-10-25 LAB — PSA, MEDICARE: PSA: 1.75 ng/ml (ref 0.10–4.00)

## 2019-10-25 MED ORDER — OMEGA-3-ACID ETHYL ESTERS 1 G PO CAPS: 4.0000 | ORAL_CAPSULE | Freq: Every day | ORAL | 0 refills | Status: DC

## 2019-10-25 NOTE — Progress Notes (Signed)
Subjective:     Patient ID: Steven Pollard, male   DOB: 09-02-1956, 64 y.o.   MRN: [id number]  HPI  Steven Pollard is here for welcome to Medicare exam and medical follow-up.  His chronic problems include history of GERD, hyperlipidemia, past history of increased PSA velocity, alpha gal allergy.  Generally doing well.  He is overdue for colonoscopy.  He just turned 63.  He needs Prevnar.  He has hyperlipidemia treated with Crestor and Lovaza.  Requesting refills of Lovaza through [facility] mail order.  Denies any side effects from medications.  He has chronic right otitis media.  No recent ear pain.  He has some chronic mild drainage.  No recent fevers or chills.  Past Medical History:  Diagnosis Date  . Colon polyps   . GERD (gastroesophageal reflux disease)   . History of seizure disorder   . Hyperlipemia   . Prostate disease   . Wears glasses    Past Surgical History:  Procedure Laterality Date  . BACK SURGERY  2018  . HERNIA REPAIR  1984   left  . perforated eardrum  1995  . [place]  . TONSILLECTOMY  1962    reports that he quit smoking about 13 years ago. His smoking use included cigarettes. He has a 34.00 pack-year smoking history. He has never used smokeless tobacco. He reports current alcohol use. He reports that he does not use drugs. family history includes Arthritis in his mother; Heart disease (age of onset: 73) in his father. Allergies  Allergen Reactions  . Latex     Unknown   . Oxycodone-Acetaminophen     dizzines   . Tyloxapol Other (See Comments)    vertigo  . Caffeine Rash   1.  Risk factors based on Past Medical , Social, and Family history reviewed and as indicated above with no changes  2.  Limitations in physical activities None.  No recent falls.  Low risk for falls.  3.  Depression/mood No active depression or anxiety issues PHQ 2 equals 0  4.  Hearing No defiits  5.  ADLs independent in all.  6.  Cognitive function (orientation to  time and place, language, writing, speech,memory) no short or long term memory issues.  Language and judgement intact.  7.  Home Safety no issues  8.  Height, weight, and visual acuity.all stable.  Visual acuity screen per nurse  Wt Readings from Last 3 Encounters:  10/25/19 200 lb (90.7 kg)  10/25/18 195 lb 14.4 oz (88.9 kg)  02/09/18 202 lb 12.8 oz (92 kg)    9.  Counseling discussed - Counseled regarding age and gender appropriate preventative screenings and immunizations.  10. Recommendation of preventive services.  Recommend Prevnar and repeat Pneumovax next year.  Needs follow-up colonoscopy.  He will schedule with his gastroenterologist  11. Labs based on risk factors-lipid, hepatic, basic metabolic panel, alpha gal antibody, PSA  12. Care Plan-as below  13. Other Providers-Dr. [place] cardiology  14. Written schedule of screening/prevention services given to patient. Health Maintenance  Topic Date Due  . HIV Screening  12/12/1968  . PNA vac Low Risk Adult (1 of 2 - PCV13) 12/13/2018  . COLONOSCOPY  10/15/2019  . TETANUS/TDAP  08/27/2020  . INFLUENZA VACCINE  Completed  . Hepatitis C Screening  Completed   15.  Advanced Directives- not documented in chart.   HIs wife is a physician and he is aware of Living Will, Medical Power of Attorney  Review  of Systems  Constitutional: Negative for activity change, appetite change, fatigue and fever.  HENT: Negative for congestion, ear pain and trouble swallowing.   Eyes: Negative for pain and visual disturbance.  Respiratory: Negative for cough, shortness of breath and wheezing.   Cardiovascular: Negative for chest pain and palpitations.  Gastrointestinal: Negative for abdominal distention, abdominal pain, blood in stool, constipation, diarrhea, nausea, rectal pain and vomiting.  Genitourinary: Negative for dysuria, hematuria and testicular pain.  Musculoskeletal: Negative for arthralgias and joint swelling.  Skin: Negative for  rash.  Neurological: Negative for dizziness, syncope and headaches.  Hematological: Negative for adenopathy.  Psychiatric/Behavioral: Negative for confusion and dysphoric mood.       Objective:   Physical Exam Constitutional:      General: He is not in acute distress.    Appearance: He is well-developed.  HENT:     Head: Normocephalic and atraumatic.     Right Ear: External ear normal.     Left Ear: External ear normal.     Ears:     Comments: He has distorted landmarks right eardrum and chronic effusion. Eyes:     Conjunctiva/sclera: Conjunctivae normal.     Pupils: Pupils are equal, round, and reactive to light.  Neck:     Thyroid: No thyromegaly.  Cardiovascular:     Rate and Rhythm: Normal rate and regular rhythm.     Heart sounds: Normal heart sounds. No murmur.  Pulmonary:     Effort: No respiratory distress.     Breath sounds: No wheezing or rales.  Abdominal:     General: Bowel sounds are normal. There is no distension.     Palpations: Abdomen is soft. There is no mass.     Tenderness: There is no abdominal tenderness. There is no guarding or rebound.  Musculoskeletal:     Cervical back: Normal range of motion and neck supple.     Right lower leg: No edema.     Left lower leg: No edema.  Lymphadenopathy:     Cervical: No cervical adenopathy.  Skin:    Findings: No rash.     Comments: He has several scattered seborrheic keratoses on the back.  No atypical features.  Neurological:     Mental Status: He is alert and oriented to person, place, and time.     Cranial Nerves: No cranial nerve deficit.     Deep Tendon Reflexes: Reflexes normal.        Assessment:     #1 Welcome to Medicare physical exam.  We addressed several health maintenance issues as below  #2 history of dyslipidemia.  In need of refills for Lovaza  #3 history of alpha gal allergy.  Requesting repeat antibody levels.    Plan:     -Flu vaccine already given -Shingles vaccine  up-to-date -Recommend Prevnar 13 and Pneumovax in 1 year -Set up repeat colonoscopy -Obtain follow-up labs including PSA -Continue red meat avoidance with his alpha gal allergy -Sent in refills of Lovaza for 1 year -we deferred EKG as he had recently per cardiology.  [name] MD [facility]

## 2019-10-25 NOTE — Patient Instructions (Signed)
Preventive Care 66 Years and Older, Male Preventive care refers to lifestyle choices and visits with your health care provider that can promote health and wellness. This includes:  A yearly physical exam. This is also called an annual well check.  Regular dental and eye exams.  Immunizations.  Screening for certain conditions.  Healthy lifestyle choices, such as diet and exercise. What can I expect for my preventive care visit? Physical exam Your health care provider will check:  Height and weight. These may be used to calculate body mass index (BMI), which is a measurement that tells if you are at a healthy weight.  Heart rate and blood pressure.  Your skin for abnormal spots. Counseling Your health care provider may ask you questions about:  Alcohol, tobacco, and drug use.  Emotional well-being.  Home and relationship well-being.  Sexual activity.  Eating habits.  History of falls.  Memory and ability to understand (cognition).  Work and work Statistician. What immunizations do I need?  Influenza (flu) vaccine  This is recommended every year. Tetanus, diphtheria, and pertussis (Tdap) vaccine  You may need a Td booster every 10 years. Varicella (chickenpox) vaccine  You may need this vaccine if you have not already been vaccinated. Zoster (shingles) vaccine  You may need this after age 66. Pneumococcal conjugate (PCV13) vaccine  One dose is recommended after age 40. Pneumococcal polysaccharide (PPSV23) vaccine  One dose is recommended after age 24. Measles, mumps, and rubella (MMR) vaccine  You may need at least one dose of MMR if you were born in 1957 or later. You may also need a second dose. Meningococcal conjugate (MenACWY) vaccine  You may need this if you have certain conditions. Hepatitis A vaccine  You may need this if you have certain conditions or if you travel or work in places where you may be exposed to hepatitis A. Hepatitis B  vaccine  You may need this if you have certain conditions or if you travel or work in places where you may be exposed to hepatitis B. Haemophilus influenzae type b (Hib) vaccine  You may need this if you have certain conditions. You may receive vaccines as individual doses or as more than one vaccine together in one shot (combination vaccines). Talk with your health care provider about the risks and benefits of combination vaccines. What tests do I need? Blood tests  Lipid and cholesterol levels. These may be checked every 5 years, or more frequently depending on your overall health.  Hepatitis C test.  Hepatitis B test. Screening  Lung cancer screening. You may have this screening every year starting at age 66 if you have a 30-pack-year history of smoking and currently smoke or have quit within the past 15 years.  Colorectal cancer screening. All adults should have this screening starting at age 66 and continuing until age 66. Your health care provider may recommend screening at age 66 if you are at increased risk. You will have tests every 1-10 years, depending on your results and the type of screening test.  Prostate cancer screening. Recommendations will vary depending on your family history and other risks.  Diabetes screening. This is done by checking your blood sugar (glucose) after you have not eaten for a while (fasting). You may have this done every 1-3 years.  Abdominal aortic aneurysm (AAA) screening. You may need this if you are a current or former smoker.  Sexually transmitted disease (STD) testing. Follow these instructions at home: Eating and drinking  Eat  a diet that includes fresh fruits and vegetables, whole grains, lean protein, and low-fat dairy products. Limit your intake of foods with high amounts of sugar, saturated fats, and salt.  Take vitamin and mineral supplements as recommended by your health care provider.  Do not drink alcohol if your health care  provider tells you not to drink.  If you drink alcohol: ? Limit how much you have to 0-2 drinks a day. ? Be aware of how much alcohol is in your drink. In the [place] one drink equals one 12 oz bottle of beer (355 mL), one 5 oz glass of wine (148 mL), or one 1 oz glass of hard liquor (44 mL). Lifestyle  Take daily care of your teeth and gums.  Stay active. Exercise for at least 30 minutes on 5 or more days each week.  Do not use any products that contain nicotine or tobacco, such as cigarettes, e-cigarettes, and chewing tobacco. If you need help quitting, ask your health care provider.  If you are sexually active, practice safe sex. Use a condom or other form of protection to prevent STIs (sexually transmitted infections).  Talk with your health care provider about taking a low-dose aspirin or statin. What's next?  Visit your health care provider once a year for a well check visit. 64  Ask your health care provider how often you should have your eyes and teeth checked.  Stay up to date on all vaccines. This information is not intended to replace advice given to you by your health care provider. Make sure you discuss any questions you have with your health care provider. Document Revised: 09/22/2018 Document Reviewed: 09/22/2018 Elsevier Patient Education  2020 [employer].

## 2019-10-26 ENCOUNTER — Encounter: Payer: Self-pay | Admitting: Family Medicine

## 2019-10-27 ENCOUNTER — Ambulatory Visit: Payer: Medicare HMO | Admitting: Family Medicine

## 2019-10-31 ENCOUNTER — Encounter: Payer: Self-pay | Admitting: Family Medicine

## 2019-10-31 LAB — GALACTOSE-ALPHA-1,3-GALACTOSE IGE: Galactose-alpha-1,3-galactose IgE: 4.99 kU/L — ABNORMAL HIGH (ref <0.10)

## 2020-01-24 ENCOUNTER — Other Ambulatory Visit: Payer: Self-pay | Admitting: Family Medicine

## 2020-06-01 ENCOUNTER — Other Ambulatory Visit: Payer: Self-pay | Admitting: Family Medicine

## 2020-07-12 DIAGNOSIS — M5416 Radiculopathy, lumbar region: Secondary | ICD-10-CM | POA: Diagnosis not present

## 2020-08-12 ENCOUNTER — Other Ambulatory Visit: Payer: Self-pay

## 2020-08-12 MED ORDER — ROSUVASTATIN CALCIUM 10 MG PO TABS: 10.0000 mg | ORAL_TABLET | Freq: Every day | ORAL | 0 refills | Status: DC

## 2020-08-12 MED ORDER — OMEGA-3-ACID ETHYL ESTERS 1 G PO CAPS: ORAL_CAPSULE | ORAL | 0 refills | Status: DC

## 2020-09-20 ENCOUNTER — Ambulatory Visit
Admission: RE | Admit: 2020-09-20 | Discharge: 2020-09-20 | Disposition: A | Discharge status: Home / Self Care | Payer: Medicare HMO | Source: Ambulatory Visit | Attending: Acute Care | Admitting: Acute Care

## 2020-09-20 DIAGNOSIS — Z122 Encounter for screening for malignant neoplasm of respiratory organs: Secondary | ICD-10-CM

## 2020-09-20 DIAGNOSIS — Z87891 Personal history of nicotine dependence: Secondary | ICD-10-CM

## 2020-09-20 DIAGNOSIS — N200 Calculus of kidney: Secondary | ICD-10-CM | POA: Diagnosis not present

## 2020-09-20 DIAGNOSIS — J432 Centrilobular emphysema: Secondary | ICD-10-CM | POA: Diagnosis not present

## 2020-09-20 DIAGNOSIS — I2510 Atherosclerotic heart disease of native coronary artery without angina pectoris: Secondary | ICD-10-CM | POA: Diagnosis not present

## 2020-09-27 ENCOUNTER — Ambulatory Visit: Payer: Medicare HMO | Attending: Internal Medicine

## 2020-09-27 DIAGNOSIS — Z23 Encounter for immunization: Secondary | ICD-10-CM

## 2020-09-27 NOTE — Progress Notes (Signed)
Please call patient and let them  know their  low dose Ct was read as a Lung RADS 1, negative study: no nodules or definitely benign nodules. Radiology recommendation is for a repeat LDCT in 12 months. .Please let them  know we will order and schedule their  annual screening scan for 09/2021. Please let them  know there was notation of CAD on their  scan.  Please remind the patient  that this is a non-gated exam therefore degree or severity of disease  cannot be determined. Please have them  follow up with their PCP regarding potential risk factor modification, dietary therapy or pharmacologic therapy if clinically indicated. Pt.  is  currently on statin therapy. Please place order for annual  screening scan for  09/2021 and fax results to PCP. Thanks so much.  This patient was seen by cards 02/2018, but it does not look like he has been seen since then. Please have him follow up with PCP about seeing cardiology again. Thanks

## 2020-09-27 NOTE — Progress Notes (Signed)
   Covid-19 Vaccination Clinic  Name:  Steven Pollard    MRN: [id number] DOB: 25-Oct-1955  09/27/2020  [name] was observed post Covid-19 immunization for 15 minutes without incident. He was provided with Vaccine Information Sheet and instruction to access the V-Safe system.   [name] was instructed to call 911 with any severe reactions post vaccine: Marland Kitchen Difficulty breathing  . Swelling of face and throat  . A fast heartbeat  . A bad rash all over body  . Dizziness and weakness   Immunizations Administered    Name Date Dose VIS Date Route   Pfizer COVID-19 Vaccine 09/27/2020  2:10 PM 0.3 mL 07/31/2020 Intramuscular   Manufacturer: [place]   Lot: [id number]   [place]: [id number]

## 2020-10-02 ENCOUNTER — Other Ambulatory Visit: Payer: Self-pay | Admitting: *Deleted

## 2020-10-02 DIAGNOSIS — Z87891 Personal history of nicotine dependence: Secondary | ICD-10-CM

## 2020-10-03 DIAGNOSIS — D1801 Hemangioma of skin and subcutaneous tissue: Secondary | ICD-10-CM | POA: Diagnosis not present

## 2020-10-03 DIAGNOSIS — L812 Freckles: Secondary | ICD-10-CM | POA: Diagnosis not present

## 2020-10-03 DIAGNOSIS — L820 Inflamed seborrheic keratosis: Secondary | ICD-10-CM | POA: Diagnosis not present

## 2020-10-03 DIAGNOSIS — L821 Other seborrheic keratosis: Secondary | ICD-10-CM | POA: Diagnosis not present

## 2020-10-29 ENCOUNTER — Encounter: Payer: Medicare HMO | Admitting: Family Medicine

## 2020-10-29 ENCOUNTER — Encounter: Payer: Self-pay | Admitting: Family Medicine

## 2020-10-29 ENCOUNTER — Ambulatory Visit (INDEPENDENT_AMBULATORY_CARE_PROVIDER_SITE_OTHER): Payer: Medicare HMO | Admitting: Family Medicine

## 2020-10-29 ENCOUNTER — Other Ambulatory Visit: Payer: Self-pay

## 2020-10-29 VITALS — BP 132/86 | HR 68 | Temp 97.4°F | Ht 70.5 in | Wt 204.0 lb

## 2020-10-29 DIAGNOSIS — Z0000 Encounter for general adult medical examination without abnormal findings: Secondary | ICD-10-CM | POA: Diagnosis not present

## 2020-10-29 DIAGNOSIS — I700 Atherosclerosis of aorta: Secondary | ICD-10-CM

## 2020-10-29 LAB — BASIC METABOLIC PANEL
BUN: 8 mg/dL (ref 6–23)
CO2: 32 mEq/L (ref 19–32)
Calcium: 9.8 mg/dL (ref 8.4–10.5)
Chloride: 103 mEq/L (ref 96–112)
Creatinine, Ser: 0.95 mg/dL (ref 0.40–1.50)
GFR: 83.19 mL/min (ref >60.00)
Glucose, Bld: 97 mg/dL (ref 70–99)
Potassium: 4.1 mEq/L (ref 3.5–5.1)
Sodium: 140 mEq/L (ref 135–145)

## 2020-10-29 LAB — LIPID PANEL
Cholesterol: 106 mg/dL (ref 0–200)
HDL: 32.8 mg/dL — ABNORMAL LOW (ref >39.00)
LDL Cholesterol: 56 mg/dL (ref 0–99)
NonHDL: 73.69
Total CHOL/HDL Ratio: 3
Triglycerides: 89 mg/dL (ref 0.0–149.0)
VLDL: 17.8 mg/dL (ref 0.0–40.0)

## 2020-10-29 LAB — CBC WITH DIFFERENTIAL/PLATELET
Basophils Absolute: 0 10*3/uL (ref 0.0–0.1)
Basophils Relative: 0.5 % (ref 0.0–3.0)
Eosinophils Absolute: 0 10*3/uL (ref 0.0–0.7)
Eosinophils Relative: 0 % (ref 0.0–5.0)
HCT: 46.2 % (ref 39.0–52.0)
Hemoglobin: 15.8 g/dL (ref 13.0–17.0)
Lymphocytes Relative: 32.6 % (ref 12.0–46.0)
Lymphs Abs: 1.8 10*3/uL (ref 0.7–4.0)
MCHC: 34.2 g/dL (ref 30.0–36.0)
MCV: 95.2 fl (ref 78.0–100.0)
Monocytes Absolute: 0.5 10*3/uL (ref 0.1–1.0)
Monocytes Relative: 8.5 % (ref 3.0–12.0)
Neutro Abs: 3.3 10*3/uL (ref 1.4–7.7)
Neutrophils Relative %: 58.4 % (ref 43.0–77.0)
Platelets: 149 10*3/uL — ABNORMAL LOW (ref 150.0–400.0)
RBC: 4.85 Mil/uL (ref 4.22–5.81)
RDW: 13 % (ref 11.5–15.5)
WBC: 5.6 10*3/uL (ref 4.0–10.5)

## 2020-10-29 LAB — HEPATIC FUNCTION PANEL
ALT: 25 U/L (ref 0–53)
AST: 19 U/L (ref 0–37)
Albumin: 4.8 g/dL (ref 3.5–5.2)
Alkaline Phosphatase: 53 U/L (ref 39–117)
Bilirubin, Direct: 0.3 mg/dL (ref 0.0–0.3)
Total Bilirubin: 1.3 mg/dL — ABNORMAL HIGH (ref 0.2–1.2)
Total Protein: 6.7 g/dL (ref 6.0–8.3)

## 2020-10-29 LAB — PSA: PSA: 1.77 ng/mL (ref 0.10–4.00)

## 2020-10-29 LAB — TSH: TSH: 1.7 u[IU]/mL (ref 0.35–4.50)

## 2020-10-29 NOTE — Patient Instructions (Signed)
Preventive Care 65 Years and Older, Male Preventive care refers to lifestyle choices and visits with your health care provider that can promote health and wellness. This includes:  A yearly physical exam. This is also called an annual wellness visit.  Regular dental and eye exams.  Immunizations.  Screening for certain conditions.  Healthy lifestyle choices, such as: ? Eating a healthy diet. ? Getting regular exercise. ? Not using drugs or products that contain nicotine and tobacco. ? Limiting alcohol use. What can I expect for my preventive care visit? Physical exam Your health care provider will check your:  Height and weight. These may be used to calculate your BMI (body mass index). BMI is a measurement that tells if you are at a healthy weight.  Heart rate and blood pressure.  Body temperature.  Skin for abnormal spots. Counseling Your health care provider may ask you questions about your:  Past medical problems.  Family's medical history.  Alcohol, tobacco, and drug use.  Emotional well-being.  Home life and relationship well-being.  Sexual activity.  Diet, exercise, and sleep habits.  History of falls.  Memory and ability to understand (cognition).  Work and work environment.  Access to firearms. What immunizations do I need? Vaccines are usually given at various ages, according to a schedule. Your health care provider will recommend vaccines for you based on your age, medical history, and lifestyle or other factors, such as travel or where you work.   What tests do I need? Blood tests  Lipid and cholesterol levels. These may be checked every 5 years, or more often depending on your overall health.  Hepatitis C test.  Hepatitis B test. Screening  Lung cancer screening. You may have this screening every year starting at age 55 if you have a 30-pack-year history of smoking and currently smoke or have quit within the past 15 years.  Colorectal  cancer screening. ? All adults should have this screening starting at age 50 and continuing until age 75. ? Your health care provider may recommend screening at age 45 if you are at increased risk. ? You will have tests every 1-10 years, depending on your results and the type of screening test.  Prostate cancer screening. Recommendations will vary depending on your family history and other risks.  Genital exam to check for testicular cancer or hernias.  Diabetes screening. ? This is done by checking your blood sugar (glucose) after you have not eaten for a while (fasting). ? You may have this done every 1-3 years.  Abdominal aortic aneurysm (AAA) screening. You may need this if you are a current or former smoker.  STD (sexually transmitted disease) testing, if you are at risk. Follow these instructions at home: Eating and drinking  Eat a diet that includes fresh fruits and vegetables, whole grains, lean protein, and low-fat dairy products. Limit your intake of foods with high amounts of sugar, saturated fats, and salt.  Take vitamin and mineral supplements as recommended by your health care provider.  Do not drink alcohol if your health care provider tells you not to drink.  If you drink alcohol: ? Limit how much you have to 0-2 drinks a day. ? Be aware of how much alcohol is in your drink. In the [place] one drink equals one 12 oz bottle of beer (355 mL), one 5 oz glass of wine (148 mL), or one 1 oz glass of hard liquor (44 mL).   Lifestyle  Take daily care of your teeth   and gums. Brush your teeth every morning and night with fluoride toothpaste. Floss one time each day.  Stay active. Exercise for at least 30 minutes 5 or more days each week.  Do not use any products that contain nicotine or tobacco, such as cigarettes, e-cigarettes, and chewing tobacco. If you need help quitting, ask your health care provider.  Do not use drugs.  If you are sexually active, practice safe sex.  Use a condom or other form of protection to prevent STIs (sexually transmitted infections).  Talk with your health care provider about taking a low-dose aspirin or statin.  Find healthy ways to cope with stress, such as: ? Meditation, yoga, or listening to music. ? Journaling. ? Talking to a trusted person. ? Spending time with friends and family. Safety  Always wear your seat belt while driving or riding in a vehicle.  Do not drive: ? If you have been drinking alcohol. Do not ride with someone who has been drinking. ? When you are tired or distracted. ? While texting.  Wear a helmet and other protective equipment during sports activities.  If you have firearms in your house, make sure you follow all gun safety procedures. What's next?  Visit your health care provider once a year for an annual wellness visit.  Ask your health care provider how often you should have your eyes and teeth checked.  Stay up to date on all vaccines. This information is not intended to replace advice given to you by your health care provider. Make sure you discuss any questions you have with your health care provider. Document Revised: 06/27/2019 Document Reviewed: 09/22/2018 Elsevier Patient Education  2021 [employer]  

## 2020-10-29 NOTE — Progress Notes (Signed)
Established Patient Office Visit  Subjective:  Patient ID: Steven Pollard, male    DOB: 1956/08/24  Age: 65 y.o. MRN: [id number]  CC: No chief complaint on file.   HPI Steven Pollard presents for physical exam.  He has history of kidney stones, GERD, possible relatively mild alpha gal allergy.  He has had some recent back difficulties.  He has seen neurosurgeon and was briefly on a course of gabapentin.  His back symptoms are currently well controlled.  He has dyslipidemia and takes Crestor 10 mg daily.  He has smoked in the past and gets regular low-dose CT lung cancer screenings.  Does have aortic atherosclerosis and evidence for coronary artery calcium and on his imaging.  We had referred him to cardiology couple years ago when he had stress test that was unremarkable.  No recent chest pains.   Health maintenance reviewed.  He is due for repeat colonoscopy.  Needs tetanus booster this year.  Also needs flu vaccine as well as Pneumovax.  Social history-married.  His wife is a Engineer, drilling.  She currently does telemedicine.  He is retired.  They have 2 children.  3 grandchildren  Family history-Father had CAD around age 26.  He has a brother and sister who have no major health problems.  Past Medical History:  Diagnosis Date  . Colon polyps   . GERD (gastroesophageal reflux disease)   . History of seizure disorder   . Hyperlipemia   . Prostate disease   . Wears glasses     Past Surgical History:  Procedure Laterality Date  . BACK SURGERY  2018  . HERNIA REPAIR  1984   left  . perforated eardrum  1995  . [place]  . TONSILLECTOMY  1962    Family History  Problem Relation Age of Onset  . Arthritis Mother   . Heart disease Father 3       heart attack    Social History   Socioeconomic History  . Marital status: Married    Spouse name: Not on file  . Number of children: Not on file  . Years of education: Not on file  . Highest education level: Not on file   Occupational History  . Not on file  Tobacco Use  . Smoking status: Former Smoker    Packs/day: 1.00    Years: 34.00    Pack years: 34.00    Types: Cigarettes    Quit date: 2008    Years since quitting: 14.0  . Smokeless tobacco: Never Used  . Tobacco comment: Encouraged to remain smoke free  Vaping Use  . Vaping Use: Never used  Substance and Sexual Activity  . Alcohol use: Yes    Comment: socially  . Drug use: No  . Sexual activity: Not on file  Other Topics Concern  . Not on file  Social History Narrative  . Not on file   Social Determinants of Health   Financial Resource Strain: Not on file  Food Insecurity: Not on file  Transportation Needs: Not on file  Physical Activity: Not on file  Stress: Not on file  Social Connections: Not on file  Intimate Partner Violence: Not on file    Outpatient Medications Prior to Visit  Medication Sig Dispense Refill  . aspirin (ASPIRIN LOW DOSE) 81 MG tablet Take 1 tablet by mouth daily.    Marland Kitchen augmented betamethasone dipropionate (DIPROLENE-AF) 0.05 % ointment Apply topically 2 (two) times daily.    Marland Kitchen  celecoxib (CELEBREX) 200 MG capsule TAKE 1 CAPSULE BY MOUTH EVERY DAY (Patient taking differently: Take 200 mg by mouth as needed. ) 30 capsule 2  . cetirizine (ZYRTEC) 10 MG tablet Take 10 mg by mouth as needed.     . cimetidine (TAGAMET) 200 MG tablet Take 200 mg by mouth daily as needed.    Marland Kitchen EPINEPHrine (EPIPEN 2-PAK) 0.3 mg/0.3 mL IJ SOAJ injection Inject 0.3 mLs (0.3 mg total) into the muscle once as needed (difficulty breathing, hypotension, oral swelling). 1 Device 0  . famotidine (PEPCID) 20 MG tablet Take 20 mg by mouth as needed for heartburn or indigestion.    . hydrocortisone 2.5 % cream     . omega-3 acid ethyl esters (LOVAZA) 1 g capsule TAKE 4 CAPSULES EVERY DAY 360 capsule 0  . rosuvastatin (CRESTOR) 10 MG tablet Take 1 tablet (10 mg total) by mouth daily. 90 tablet 0   No facility-administered medications prior to  visit.    Allergies  Allergen Reactions  . Latex     Unknown   . Oxycodone-Acetaminophen     dizzines   . Tyloxapol Other (See Comments)    vertigo  . Caffeine Rash    ROS Review of Systems  Constitutional: Negative for activity change, appetite change, fatigue, fever and unexpected weight change.  HENT: Negative for congestion, ear pain and trouble swallowing.   Eyes: Negative for pain and visual disturbance.  Respiratory: Negative for cough, shortness of breath and wheezing.   Cardiovascular: Negative for chest pain and palpitations.  Gastrointestinal: Negative for abdominal distention, abdominal pain, blood in stool, constipation, diarrhea, nausea, rectal pain and vomiting.  Endocrine: Negative for polydipsia and polyuria.  Genitourinary: Negative for dysuria, hematuria and testicular pain.  Musculoskeletal: Negative for arthralgias and joint swelling.  Skin: Negative for rash.  Neurological: Negative for dizziness, syncope and headaches.  Hematological: Negative for adenopathy.  Psychiatric/Behavioral: Negative for confusion and dysphoric mood.      Objective:    Physical Exam Vitals reviewed.  Constitutional:      General: He is not in acute distress.    Appearance: Normal appearance. He is well-developed and well-nourished.  HENT:     Head: Normocephalic and atraumatic.     Right Ear: External ear normal.     Left Ear: External ear normal.     Ears:     Comments: Extensive scarring and distorted landmarks of right eardrum from chronic infection.  He has moderate erythema.    Mouth/Throat:     Mouth: Oropharynx is clear and moist.  Eyes:     Extraocular Movements: EOM normal.     Conjunctiva/sclera: Conjunctivae normal.     Pupils: Pupils are equal, round, and reactive to light.  Neck:     Thyroid: No thyromegaly.  Cardiovascular:     Rate and Rhythm: Normal rate and regular rhythm.     Heart sounds: Normal heart sounds. No murmur heard.   Pulmonary:      Effort: No respiratory distress.     Breath sounds: No wheezing or rales.  Abdominal:     General: Bowel sounds are normal. There is no distension.     Palpations: Abdomen is soft. There is no mass.     Tenderness: There is no abdominal tenderness. There is no guarding or rebound.  Musculoskeletal:        General: No edema.     Cervical back: Normal range of motion and neck supple.     Right lower leg:  No edema.     Left lower leg: No edema.  Lymphadenopathy:     Cervical: No cervical adenopathy.  Skin:    Findings: No rash.  Neurological:     Mental Status: He is alert and oriented to person, place, and time.     Cranial Nerves: No cranial nerve deficit.  Psychiatric:        Mood and Affect: Mood and affect normal.     BP 132/86 (BP Location: Left Arm, Patient Position: Sitting, Cuff Size: Normal)   Pulse 68   Temp (!) 97.4 F (36.3 C) (Oral)   Ht 5' 10.5" (1.791 m)   Wt 204 lb (92.5 kg)   SpO2 98%   BMI 28.86 kg/m  Wt Readings from Last 3 Encounters:  10/29/20 204 lb (92.5 kg)  10/25/19 200 lb (90.7 kg)  10/25/18 195 lb 14.4 oz (88.9 kg)     Health Maintenance Due  Topic Date Due  . COLONOSCOPY (Pts 45-94yrs Insurance coverage will need to be confirmed)  10/15/2019  . INFLUENZA VACCINE  05/12/2020  . TETANUS/TDAP  08/27/2020  . PNA vac Low Risk Adult (2 of 2 - PPSV23) 10/24/2020    There are no preventive care reminders to display for this patient.  Lab Results  Component Value Date   TSH 1.12 10/25/2018   Lab Results  Component Value Date   WBC 5.0 10/25/2018   HGB 16.0 10/25/2018   HCT 46.1 10/25/2018   MCV 96.7 10/25/2018   PLT 207.0 10/25/2018   Lab Results  Component Value Date   NA 142 10/25/2019   K 4.3 10/25/2019   CO2 30 10/25/2019   GLUCOSE 109 (H) 10/25/2019   BUN 11 10/25/2019   CREATININE 0.95 10/25/2019   BILITOT 1.0 10/25/2019   ALKPHOS 68 10/25/2019   AST 18 10/25/2019   ALT 23 10/25/2019   PROT 6.7 10/25/2019   ALBUMIN 4.7  10/25/2019   CALCIUM 9.9 10/25/2019   ANIONGAP 8 03/27/2017   GFR 79.35 10/25/2019   Lab Results  Component Value Date   CHOL 132 10/25/2019   Lab Results  Component Value Date   HDL 44.60 10/25/2019   Lab Results  Component Value Date   LDLCALC 73 10/25/2019   Lab Results  Component Value Date   TRIG 72.0 10/25/2019   Lab Results  Component Value Date   CHOLHDL 3 10/25/2019   Lab Results  Component Value Date   HGBA1C 5.5 05/13/2016      Assessment & Plan:   Problem List Items Addressed This Visit   None   Visit Diagnoses    Physical exam    -  Primary   Relevant Orders   Basic metabolic panel   Lipid panel   CBC with Differential/Platelet   TSH   Hepatic function panel   PSA    -Needs follow-up colonoscopy and he plans to set this up with [facility]- whom he has seen in the past -Needs flu vaccine and Pneumovax.  We had no vaccines in clinic today as they were taken out because of recent impending winter storm.  He will return later for these versus consider getting these at local pharmacy -Obtain follow-up screening labs -Continue annual low-dose CT lung cancer screening -We discussed his aortic atherosclerosis from imaging.  He has seen cardiology previously.  Consider periodic follow-up with cardiology for repeat screening/stress testing  No orders of the defined types were placed in this encounter.   Follow-up: No follow-ups on file.  Carolann Littler, MD

## 2020-11-14 ENCOUNTER — Other Ambulatory Visit: Payer: Self-pay

## 2020-11-14 ENCOUNTER — Other Ambulatory Visit: Payer: Self-pay | Admitting: Family Medicine

## 2020-11-14 MED ORDER — ROSUVASTATIN CALCIUM 10 MG PO TABS: 10.0000 mg | ORAL_TABLET | Freq: Every day | ORAL | 0 refills | Status: DC

## 2021-01-14 ENCOUNTER — Other Ambulatory Visit: Payer: Self-pay | Admitting: Family Medicine

## 2021-01-28 ENCOUNTER — Ambulatory Visit (INDEPENDENT_AMBULATORY_CARE_PROVIDER_SITE_OTHER): Payer: Medicare HMO

## 2021-01-28 ENCOUNTER — Other Ambulatory Visit: Payer: Self-pay

## 2021-01-28 VITALS — BP 120/80 | HR 77 | Temp 98.3°F | Resp 20 | Wt 202.9 lb

## 2021-01-28 DIAGNOSIS — Z0000 Encounter for general adult medical examination without abnormal findings: Secondary | ICD-10-CM | POA: Diagnosis not present

## 2021-01-28 NOTE — Patient Instructions (Signed)
Steven Pollard , Thank you for taking time to come for your Medicare Wellness Visit. I appreciate ongoing commitment to your health goals. Please review the following plan we discussed and let me know if I can assist you in the future.   Screening recommendations/referrals: Colonoscopy: Pt will call Dr Kalman Shan Recommended yearly ophthalmology/optometry visit for glaucoma screening and checkup Recommended yearly dental visit for hygiene and checkup  Vaccinations: Influenza vaccine: Postponed 05/12/21 Pneumococcal vaccine: Due and discussed Tdap vaccine: Due and discussed Shingles vaccine: Shingrix discussed. Please contact your pharmacy for coverage information.    Covid-19: Completed 2/6, 3/1, & 09/27/20  Advanced directives: Please bring a copy of your health care power of attorney and living will to the office at your convenience.   Conditions/risks identified: Lose weight  Next appointment: Follow up in one year for your annual wellness visit.   Preventive Care 31 Years and Older, Male Preventive care refers to lifestyle choices and visits with your health care provider that can promote health and wellness. What does preventive care include?  A yearly physical exam. This is also called an annual well check.  Dental exams once or twice a year.  Routine eye exams. Ask your health care provider how often you should have your eyes checked.  Personal lifestyle choices, including:  Daily care of your teeth and gums.  Regular physical activity.  Eating a healthy diet.  Avoiding tobacco and drug use.  Limiting alcohol use.  Practicing safe sex.  Taking low doses of aspirin every day.  Taking vitamin and mineral supplements as recommended by your health care provider. What happens during an annual well check? The services and screenings done by your health care provider during your annual well check will depend on your age, overall health, lifestyle risk factors, and family  history of disease. Counseling  Your health care provider may ask you questions about your:  Alcohol use.  Tobacco use.  Drug use.  Emotional well-being.  Home and relationship well-being.  Sexual activity.  Eating habits.  History of falls.  Memory and ability to understand (cognition).  Work and work Statistician. Screening  You may have the following tests or measurements:  Height, weight, and BMI.  Blood pressure.  Lipid and cholesterol levels. These may be checked every 5 years, or more frequently if you are over 68 years old.  Skin check.  Lung cancer screening. You may have this screening every year starting at age 2 if you have a 30-pack-year history of smoking and currently smoke or have quit within the past 15 years.  Fecal occult blood test (FOBT) of the stool. You may have this test every year starting at age 31.  Flexible sigmoidoscopy or colonoscopy. You may have a sigmoidoscopy every 5 years or a colonoscopy every 10 years starting at age 48.  Prostate cancer screening. Recommendations will vary depending on your family history and other risks.  Hepatitis C blood test.  Hepatitis B blood test.  Sexually transmitted disease (STD) testing.  Diabetes screening. This is done by checking your blood sugar (glucose) after you have not eaten for a while (fasting). You may have this done every 1-3 years.  Abdominal aortic aneurysm (AAA) screening. You may need this if you are a current or former smoker.  Osteoporosis. You may be screened starting at age 25 if you are at high risk. Talk with your health care provider about your test results, treatment options, and if necessary, the need for more tests. Vaccines  Your health care provider may recommend certain vaccines, such as:  Influenza vaccine. This is recommended every year.  Tetanus, diphtheria, and acellular pertussis (Tdap, Td) vaccine. You may need a Td booster every 10 years.  Zoster vaccine.  You may need this after age 24.  Pneumococcal 13-valent conjugate (PCV13) vaccine. One dose is recommended after age 26.  Pneumococcal polysaccharide (PPSV23) vaccine. One dose is recommended after age 69. Talk to your health care provider about which screenings and vaccines you need and how often you need them. This information is not intended to replace advice given to you by your health care provider. Make sure you discuss any questions you have with your health care provider. Document Released: 10/25/2015 Document Revised: 06/17/2016 Document Reviewed: 07/30/2015 Elsevier Interactive Patient Education  2017 [place] Prevention in the Home Falls can cause injuries. They can happen to people of all ages. There are many things you can do to make your home safe and to help prevent falls. What can I do on the outside of my home?  Regularly fix the edges of walkways and driveways and fix any cracks.  Remove anything that might make you trip as you walk through a door, such as a raised step or threshold.  Trim any bushes or trees on the path to your home.  Use bright outdoor lighting.  Clear any walking paths of anything that might make someone trip, such as rocks or tools.  Regularly check to see if handrails are loose or broken. Make sure that both sides of any steps have handrails.  Any raised decks and porches should have guardrails on the edges.  Have any leaves, snow, or ice cleared regularly.  Use sand or salt on walking paths during winter.  Clean up any spills in your garage right away. This includes oil or grease spills. What can I do in the bathroom?  Use night lights.  Install grab bars by the toilet and in the tub and shower. Do not use towel bars as grab bars.  Use non-skid mats or decals in the tub or shower.  If you need to sit down in the shower, use a plastic, non-slip stool.  Keep the floor dry. Clean up any water that spills on the floor as soon  as it happens.  Remove soap buildup in the tub or shower regularly.  Attach bath mats securely with double-sided non-slip rug tape.  Do not have throw rugs and other things on the floor that can make you trip. What can I do in the bedroom?  Use night lights.  Make sure that you have a light by your bed that is easy to reach.  Do not use any sheets or blankets that are too big for your bed. They should not hang down onto the floor.  Have a firm chair that has side arms. You can use this for support while you get dressed.  Do not have throw rugs and other things on the floor that can make you trip. What can I do in the kitchen?  Clean up any spills right away.  Avoid walking on wet floors.  Keep items that you use a lot in easy-to-reach places.  If you need to reach something above you, use a strong step stool that has a grab bar.  Keep electrical cords out of the way.  Do not use floor polish or wax that makes floors slippery. If you must use wax, use non-skid floor wax.  Do  not have throw rugs and other things on the floor that can make you trip. What can I do with my stairs?  Do not leave any items on the stairs.  Make sure that there are handrails on both sides of the stairs and use them. Fix handrails that are broken or loose. Make sure that handrails are as long as the stairways.  Check any carpeting to make sure that it is firmly attached to the stairs. Fix any carpet that is loose or worn.  Avoid having throw rugs at the top or bottom of the stairs. If you do have throw rugs, attach them to the floor with carpet tape.  Make sure that you have a light switch at the top of the stairs and the bottom of the stairs. If you do not have them, ask someone to add them for you. What else can I do to help prevent falls?  Wear shoes that:  Do not have high heels.  Have rubber bottoms.  Are comfortable and fit you well.  Are closed at the toe. Do not wear sandals.  If  you use a stepladder:  Make sure that it is fully opened. Do not climb a closed stepladder.  Make sure that both sides of the stepladder are locked into place.  Ask someone to hold it for you, if possible.  Clearly mark and make sure that you can see:  Any grab bars or handrails.  First and last steps.  Where the edge of each step is.  Use tools that help you move around (mobility aids) if they are needed. These include:  Canes.  Walkers.  Scooters.  Crutches.  Turn on the lights when you go into a dark area. Replace any light bulbs as soon as they burn out.  Set up your furniture so you have a clear path. Avoid moving your furniture around.  If any of your floors are uneven, fix them.  If there are any pets around you, be aware of where they are.  Review your medicines with your doctor. Some medicines can make you feel dizzy. This can increase your chance of falling. Ask your doctor what other things that you can do to help prevent falls. This information is not intended to replace advice given to you by your health care provider. Make sure you discuss any questions you have with your health care provider. Document Released: 07/25/2009 Document Revised: 03/05/2016 Document Reviewed: 11/02/2014 Elsevier Interactive Patient Education  2017 [employer].

## 2021-01-28 NOTE — Progress Notes (Signed)
Subjective:   Steven Pollard is a 65 y.o. male who presents for Medicare Annual/Subsequent preventive examination.  Review of Systems     Cardiac Risk Factors include: male gender;dyslipidemia;advanced age (>15men, >60 women)     Objective:    Today's Vitals   01/28/21 0806  BP: 120/80  Pulse: 77  Resp: 20  Temp: 98.3 F (36.8 C)  SpO2: 97%  Weight: 202 lb 14.4 oz (92 kg)   Body mass index is 28.7 kg/m.  Advanced Directives 01/28/2021  Does Patient Have a Medical Advance Directive? Yes  Type of Advance Directive Healthcare Power of Attorney    Current Medications (verified) Outpatient Encounter Medications as of 01/28/2021  Medication Sig  . aspirin 81 MG tablet Take 1 tablet by mouth daily.  Marland Kitchen augmented betamethasone dipropionate (DIPROLENE-AF) 0.05 % ointment Apply topically 2 (two) times daily.  . cetirizine (ZYRTEC) 10 MG tablet Take 10 mg by mouth as needed.   Marland Kitchen EPINEPHrine (EPIPEN 2-PAK) 0.3 mg/0.3 mL IJ SOAJ injection Inject 0.3 mLs (0.3 mg total) into the muscle once as needed (difficulty breathing, hypotension, oral swelling).  . famotidine (PEPCID) 20 MG tablet Take 20 mg by mouth as needed for heartburn or indigestion.  . hydrocortisone 2.5 % cream   . omega-3 acid ethyl esters (LOVAZA) 1 g capsule TAKE 4 CAPSULES EVERY DAY  . rosuvastatin (CRESTOR) 10 MG tablet Take 1 tablet (10 mg total) by mouth daily.  . celecoxib (CELEBREX) 200 MG capsule TAKE 1 CAPSULE BY MOUTH EVERY DAY (Patient not taking: Reported on 01/28/2021)  . [DISCONTINUED] cimetidine (TAGAMET) 200 MG tablet Take 200 mg by mouth daily as needed. (Patient not taking: Reported on 01/28/2021)   No facility-administered encounter medications on file as of 01/28/2021.    Allergies (verified) Latex, Oxycodone-acetaminophen, Tyloxapol, and Caffeine   History: Past Medical History:  Diagnosis Date  . Colon polyps   . GERD (gastroesophageal reflux disease)   . History of seizure disorder   .  Hyperlipemia   . Prostate disease   . Wears glasses    Past Surgical History:  Procedure Laterality Date  . BACK SURGERY  2018  . HERNIA REPAIR  1984   left  . perforated eardrum  1995  . [place]  . TONSILLECTOMY  1962   Family History  Problem Relation Age of Onset  . Arthritis Mother   . Heart disease Father 68       heart attack   Social History   Socioeconomic History  . Marital status: Married    Spouse name: Not on file  . Number of children: Not on file  . Years of education: Not on file  . Highest education level: Not on file  Occupational History  . Not on file  Tobacco Use  . Smoking status: Former Smoker    Packs/day: 1.00    Years: 34.00    Pack years: 34.00    Types: Cigarettes    Quit date: 2008    Years since quitting: 14.3  . Smokeless tobacco: Never Used  . Tobacco comment: Encouraged to remain smoke free  Vaping Use  . Vaping Use: Never used  Substance and Sexual Activity  . Alcohol use: Yes    Comment: socially  . Drug use: No  . Sexual activity: Not on file  Other Topics Concern  . Not on file  Social History Narrative  . Not on file   Social Determinants of Health   Financial Resource Strain:  Low Risk   . Difficulty of Paying Living Expenses: Not hard at all  Food Insecurity: No Food Insecurity  . Worried About Charity fundraiser in the Last Year: Never true  . Ran Out of Food in the Last Year: Never true  Transportation Needs: No Transportation Needs  . Lack of Transportation (Medical): No  . Lack of Transportation (Non-Medical): No  Physical Activity: Inactive  . Days of Exercise per Week: 0 days  . Minutes of Exercise per Session: 0 min  Stress: No Stress Concern Present  . Feeling of Stress : Not at all  Social Connections: Moderately Integrated  . Frequency of Communication with Friends and Family: Twice a week  . Frequency of Social Gatherings with Friends and Family: Twice a week  . Attends Religious  Services: More than 4 times per year  . Active Member of Clubs or Organizations: No  . Attends Archivist Meetings: Never  . Marital Status: Married    Tobacco Counseling Counseling given: Not Answered Comment: Encouraged to remain smoke free   Clinical Intake:  Pre-visit preparation completed: Yes  Pain : No/denies pain     BMI - recorded: 28.7 Nutritional Status: BMI 25 -29 Overweight Nutritional Risks: None Diabetes: No  How often do you need to have someone help you when you read instructions, pamphlets, or other written materials from your doctor or pharmacy?: 1 - Never  Diabetic?No  Interpreter Needed?: No  Information entered by :: Charlott Rakes, LPN   Activities of Daily Living In your present state of health, do you have any difficulty performing the following activities: 01/28/2021  Hearing? Y  Comment right ear loss hearing  Vision? N  Difficulty concentrating or making decisions? N  Walking or climbing stairs? N  Dressing or bathing? N  Doing errands, shopping? N  Preparing Food and eating ? N  Using the Toilet? N  In the past six months, have you accidently leaked urine? N  Managing your Medications? N  Managing your Finances? N  Housekeeping or managing your Housekeeping? N  Some recent data might be hidden    Patient Care Team: [name], MD as PCP - General (Family Medicine)  Indicate any recent Medical Services you may have received from other than [facility] providers in the past year (date may be approximate).     Assessment:   This is a routine wellness examination for [name].  Hearing/Vision screen  Hearing Screening   125Hz  250Hz  500Hz  1000Hz  2000Hz  3000Hz  4000Hz  6000Hz  8000Hz   Right ear:           Left ear:           Comments: Right ear hearing loss   Vision Screening Comments: Pt follows up with Dr Delman Cheadle a few years ago will have to follow up with new provider per insurance   Dietary issues and exercise  activities discussed: Current Exercise Habits: The patient does not participate in regular exercise at present  Goals    . Patient Stated     Lose weight       Depression Screen PHQ 2/9 Scores 01/28/2021 10/25/2019 10/25/2018 01/11/2017  PHQ - 2 Score 1 0 0 0  PHQ- 9 Score - 0 - -    Fall Risk Fall Risk  01/28/2021 10/25/2019 10/25/2019 01/11/2017  Falls in the past year? 0 0 0 No  Number falls in past yr: 0 0 - -  Injury with Fall? 0 - - -  Risk for fall  due to : Impaired vision No Fall Risks - -  Follow up Falls prevention discussed - - -    FALL RISK PREVENTION PERTAINING TO THE HOME:  Any stairs in or around the home? Yes  If so, are there any without handrails? No  Home free of loose throw rugs in walkways, pet beds, electrical cords, etc? Yes  Adequate lighting in your home to reduce risk of falls? Yes   ASSISTIVE DEVICES UTILIZED TO PREVENT FALLS:  Life alert? No  Use of a cane, walker or w/c? No  Grab bars in the bathroom? Yes  Shower chair or bench in shower? Yes  Elevated toilet seat or a handicapped toilet? No   TIMED UP AND GO:  Was the test performed? Yes .  Length of time to ambulate 10 feet: 10 sec.   Gait steady and fast without use of assistive device  Cognitive Function:     6CIT Screen 01/28/2021  What Year? 0 points  What month? 0 points  Count back from 20 0 points  Months in reverse 2 points  Repeat phrase 0 points    Immunizations Immunization History  Administered Date(s) Administered  . Fluad Quad(high Dose 65+) 08/29/2019  . Influenza Split 10/12/2006, 07/12/2010  . Influenza,inj,Quad PF,6+ Mos 09/26/2014, 10/02/2015, 07/10/2016, 10/22/2017, 10/25/2018  . PFIZER(Purple Top)SARS-COV-2 Vaccination 11/18/2019, 12/11/2019, 09/27/2020  . Pneumococcal Conjugate-13 10/25/2019  . Tdap 07/30/2010  . Zoster 10/02/2015    TDAP status: Due, Education has been provided regarding the importance of this vaccine. Advised may receive this vaccine at  local pharmacy or Health Dept. Aware to provide a copy of the vaccination record if obtained from local pharmacy or Health Dept. Verbalized acceptance and understanding.  Flu Vaccine status: Declined, Education has been provided regarding the importance of this vaccine but patient still declined. Advised may receive this vaccine at local pharmacy or Health Dept. Aware to provide a copy of the vaccination record if obtained from local pharmacy or Health Dept. Verbalized acceptance and understanding.  Pneumococcal vaccine status: Due, Education has been provided regarding the importance of this vaccine. Advised may receive this vaccine at local pharmacy or Health Dept. Aware to provide a copy of the vaccination record if obtained from local pharmacy or Health Dept. Verbalized acceptance and understanding.  Covid-19 vaccine status: Completed vaccines  Qualifies for Shingles Vaccine? Yes   Zostavax completed Yes   Shingrix Completed?: No.    Education has been provided regarding the importance of this vaccine. Patient has been advised to call insurance company to determine out of pocket expense if they have not yet received this vaccine. Advised may also receive vaccine at local pharmacy or Health Dept. Verbalized acceptance and understanding.  Screening Tests Health Maintenance  Topic Date Due  . COLONOSCOPY (Pts 45-42yrs Insurance coverage will need to be confirmed)  10/15/2019  . TETANUS/TDAP  08/27/2020  . PNA vac Low Risk Adult (2 of 2 - PPSV23) 10/24/2020  . INFLUENZA VACCINE  05/12/2021  . COVID-19 Vaccine  Completed  . Hepatitis C Screening  Completed  . HPV VACCINES  Aged Out    Health Maintenance  Health Maintenance Due  Topic Date Due  . COLONOSCOPY (Pts 45-53yrs Insurance coverage will need to be confirmed)  10/15/2019  . TETANUS/TDAP  08/27/2020  . PNA vac Low Risk Adult (2 of 2 - PPSV23) 10/24/2020    Colonoscopy pt stated he will call Dr Ellender Hose for appt    Additional  Screening:  Hepatitis C Screening:  Completed 10/22/17  Vision Screening: Recommended annual ophthalmology exams for early detection of glaucoma and other disorders of the eye. Is the patient up to date with their annual eye exam?  No  Who is the provider or what is the name of the office in which the patient attends annual eye exams? Dr Delman Cheadle  If pt is not established with a provider, would they like to be referred to a provider to establish care? No .   Dental Screening: Recommended annual dental exams for proper oral hygiene  Community Resource Referral / Chronic Care Management: CRR required this visit?  No   CCM required this visit?  No      Plan:     I have personally reviewed and noted the following in the patient's chart:   . Medical and social history . Use of alcohol, tobacco or illicit drugs  . Current medications and supplements . Functional ability and status . Nutritional status . Physical activity . Advanced directives . List of other physicians . Hospitalizations, surgeries, and ER visits in previous 12 months . Vitals . Screenings to include cognitive, depression, and falls . Referrals and appointments  In addition, I have reviewed and discussed with patient certain preventive protocols, quality metrics, and best practice recommendations. A written personalized care plan for preventive services as well as general preventive health recommendations were provided to patient.     Willette Brace, LPN   4/59/1368   Nurse Notes: None

## 2021-02-06 ENCOUNTER — Other Ambulatory Visit: Payer: Self-pay | Admitting: Family Medicine

## 2021-04-25 ENCOUNTER — Other Ambulatory Visit: Payer: Self-pay | Admitting: Family Medicine

## 2021-06-09 ENCOUNTER — Other Ambulatory Visit: Payer: Self-pay | Admitting: Family Medicine

## 2021-08-04 ENCOUNTER — Ambulatory Visit (INDEPENDENT_AMBULATORY_CARE_PROVIDER_SITE_OTHER): Payer: Medicare HMO | Admitting: Family Medicine

## 2021-08-04 ENCOUNTER — Other Ambulatory Visit: Payer: Self-pay

## 2021-08-04 VITALS — BP 132/72 | HR 70 | Temp 97.5°F | Wt 196.7 lb

## 2021-08-04 DIAGNOSIS — Z23 Encounter for immunization: Secondary | ICD-10-CM | POA: Diagnosis not present

## 2021-08-04 DIAGNOSIS — M25511 Pain in right shoulder: Secondary | ICD-10-CM | POA: Diagnosis not present

## 2021-08-04 MED ORDER — ROSUVASTATIN CALCIUM 10 MG PO TABS: 10.0000 mg | ORAL_TABLET | Freq: Every day | ORAL | 3 refills | Status: DC

## 2021-08-04 MED ORDER — CELECOXIB 200 MG PO CAPS: ORAL_CAPSULE | ORAL | 1 refills | Status: DC
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
## 2021-09-10 DIAGNOSIS — L218 Other seborrheic dermatitis: Secondary | ICD-10-CM | POA: Diagnosis not present

## 2021-09-10 DIAGNOSIS — L812 Freckles: Secondary | ICD-10-CM | POA: Diagnosis not present

## 2021-09-10 DIAGNOSIS — L821 Other seborrheic keratosis: Secondary | ICD-10-CM | POA: Diagnosis not present

## 2021-09-29 ENCOUNTER — Other Ambulatory Visit: Payer: Self-pay | Admitting: Family Medicine

## 2021-10-31 ENCOUNTER — Ambulatory Visit (INDEPENDENT_AMBULATORY_CARE_PROVIDER_SITE_OTHER): Payer: Medicare HMO | Admitting: Family Medicine

## 2021-10-31 VITALS — BP 120/68 | HR 70 | Temp 98.1°F | Ht 70.5 in | Wt 190.1 lb

## 2021-10-31 DIAGNOSIS — Z0000 Encounter for general adult medical examination without abnormal findings: Secondary | ICD-10-CM | POA: Diagnosis not present

## 2021-10-31 DIAGNOSIS — Z23 Encounter for immunization: Secondary | ICD-10-CM

## 2021-10-31 LAB — BASIC METABOLIC PANEL
BUN: 11 mg/dL (ref 6–23)
CO2: 30 mEq/L (ref 19–32)
Calcium: 9.6 mg/dL (ref 8.4–10.5)
Chloride: 104 mEq/L (ref 96–112)
Creatinine, Ser: 0.9 mg/dL (ref 0.40–1.50)
GFR: 88.14 mL/min (ref >60.00)
Glucose, Bld: 99 mg/dL (ref 70–99)
Potassium: 4.3 mEq/L (ref 3.5–5.1)
Sodium: 141 mEq/L (ref 135–145)

## 2021-10-31 LAB — CBC WITH DIFFERENTIAL/PLATELET
Basophils Absolute: 0 10*3/uL (ref 0.0–0.1)
Basophils Relative: 0.6 % (ref 0.0–3.0)
Eosinophils Absolute: 0 10*3/uL (ref 0.0–0.7)
Eosinophils Relative: 0 % (ref 0.0–5.0)
HCT: 45.8 % (ref 39.0–52.0)
Hemoglobin: 15.5 g/dL (ref 13.0–17.0)
Lymphocytes Relative: 36.8 % (ref 12.0–46.0)
Lymphs Abs: 1.9 10*3/uL (ref 0.7–4.0)
MCHC: 33.9 g/dL (ref 30.0–36.0)
MCV: 95.7 fl (ref 78.0–100.0)
Monocytes Absolute: 0.6 10*3/uL (ref 0.1–1.0)
Monocytes Relative: 11.7 % (ref 3.0–12.0)
Neutro Abs: 2.6 10*3/uL (ref 1.4–7.7)
Neutrophils Relative %: 50.9 % (ref 43.0–77.0)
Platelets: 160 10*3/uL (ref 150.0–400.0)
RBC: 4.78 Mil/uL (ref 4.22–5.81)
RDW: 13.2 % (ref 11.5–15.5)
WBC: 5 10*3/uL (ref 4.0–10.5)

## 2021-10-31 LAB — HEPATIC FUNCTION PANEL
ALT: 20 U/L (ref 0–53)
AST: 19 U/L (ref 0–37)
Albumin: 4.6 g/dL (ref 3.5–5.2)
Alkaline Phosphatase: 66 U/L (ref 39–117)
Bilirubin, Direct: 0.2 mg/dL (ref 0.0–0.3)
Total Bilirubin: 1.1 mg/dL (ref 0.2–1.2)
Total Protein: 6.5 g/dL (ref 6.0–8.3)

## 2021-10-31 LAB — TSH: TSH: 1.44 u[IU]/mL (ref 0.35–5.50)

## 2021-10-31 LAB — PSA: PSA: 1.79 ng/mL (ref 0.10–4.00)

## 2021-10-31 LAB — LIPID PANEL
Cholesterol: 105 mg/dL (ref 0–200)
HDL: 37.7 mg/dL — ABNORMAL LOW (ref >39.00)
LDL Cholesterol: 54 mg/dL (ref 0–99)
NonHDL: 67.28
Total CHOL/HDL Ratio: 3
Triglycerides: 64 mg/dL (ref 0.0–149.0)
VLDL: 12.8 mg/dL (ref 0.0–40.0)

## 2021-10-31 MED ORDER — OMEGA-3-ACID ETHYL ESTERS 1 G PO CAPS: ORAL_CAPSULE | ORAL | 3 refills | Status: DC

## 2021-10-31 MED ORDER — ROSUVASTATIN CALCIUM 10 MG PO TABS: 10.0000 mg | ORAL_TABLET | Freq: Every day | ORAL | 3 refills | Status: DC

## 2021-10-31 NOTE — Progress Notes (Signed)
Established Patient Office Visit  Subjective:  Patient ID: Steven Pollard, male    DOB: Feb 24, 1956  Age: 66 y.o. MRN: [id number]  CC:  Chief Complaint  Patient presents with   Annual Exam    HPI [name] presents for physical exam.  He has history of dyslipidemia and takes rosuvastatin and Lovaza.  He would like to switch his prescriptions to mail order.  Needs follow-up labs.  He is fasting.  Generally doing well.  No specific complaints today.  He recently saw dermatologist for skin check.  Health maintenance reviewed:  -Overdue for repeat colonoscopy -Previous hepatitis C screen negative -He is due for tetanus -Had Zostavax 2016 but no history of Shingrix -Needs Pneumovax -Flu vaccine already given  Family history and social history-reviewed with no significant changes.  His wife is a physician and is doing virtual medicine currently.  Past Medical History:  Diagnosis Date   Colon polyps    GERD (gastroesophageal reflux disease)    History of seizure disorder    Hyperlipemia    Prostate disease    Wears glasses     Past Surgical History:  Procedure Laterality Date   BACK SURGERY  2018   HERNIA REPAIR  1984   left   perforated eardrum  [place]    Family History  Problem Relation Age of Onset   Arthritis Mother    Heart disease Father 103       heart attack    Social History   Socioeconomic History   Marital status: Married    Spouse name: Not on file   Number of children: Not on file   Years of education: Not on file   Highest education level: Not on file  Occupational History   Not on file  Tobacco Use   Smoking status: Former    Packs/day: 1.00    Years: 34.00    Pack years: 34.00    Types: Cigarettes    Quit date: 2008    Years since quitting: 15.0   Smokeless tobacco: Never   Tobacco comments:    Encouraged to remain smoke free  Vaping Use   Vaping Use: Never used  Substance and Sexual  Activity   Alcohol use: Yes    Comment: socially   Drug use: No   Sexual activity: Not on file  Other Topics Concern   Not on file  Social History Narrative   Not on file   Social Determinants of Health   Financial Resource Strain: Low Risk    Difficulty of Paying Living Expenses: Not hard at all  Food Insecurity: No Food Insecurity   Worried About Charity fundraiser in the Last Year: Never true   [place] in the Last Year: Never true  Transportation Needs: No Transportation Needs   Lack of Transportation (Medical): No   Lack of Transportation (Non-Medical): No  Physical Activity: Inactive   Days of Exercise per Week: 0 days   Minutes of Exercise per Session: 0 min  Stress: No Stress Concern Present   Feeling of Stress : Not at all  Social Connections: Moderately Integrated   Frequency of Communication with Friends and Family: Twice a week   Frequency of Social Gatherings with Friends and Family: Twice a week   Attends Religious Services: More than 4 times per year   Active Member of [employer] or Organizations: No   Attends Club or  Organization Meetings: Never   Marital Status: Married  Human resources officer Violence: Not At Risk   Fear of Current or Ex-Partner: No   Emotionally Abused: No   Physically Abused: No   Sexually Abused: No    Outpatient Medications Prior to Visit  Medication Sig Dispense Refill   aspirin 81 MG tablet Take 1 tablet by mouth daily.     augmented betamethasone dipropionate (DIPROLENE-AF) 0.05 % ointment Apply topically 2 (two) times daily.     celecoxib (CELEBREX) 200 MG capsule TAKE 1 CAPSULE BY MOUTH EVERY DAY 30 capsule 1   cetirizine (ZYRTEC) 10 MG tablet Take 10 mg by mouth as needed.      EPINEPHrine (EPIPEN 2-PAK) 0.3 mg/0.3 mL IJ SOAJ injection Inject 0.3 mLs (0.3 mg total) into the muscle once as needed (difficulty breathing, hypotension, oral swelling). 1 Device 0   famotidine (PEPCID) 20 MG tablet Take 20 mg by mouth as needed for  heartburn or indigestion.     hydrocortisone 2.5 % cream      omega-3 acid ethyl esters (LOVAZA) 1 g capsule TAKE 4 CAPSULES EVERY DAY 360 capsule 0   rosuvastatin (CRESTOR) 10 MG tablet Take 1 tablet (10 mg total) by mouth daily. 90 tablet 3   No facility-administered medications prior to visit.    Allergies  Allergen Reactions   Latex     Unknown    Oxycodone-Acetaminophen     dizzines    Tyloxapol Other (See Comments)    vertigo   Caffeine Rash    ROS Review of Systems  Constitutional:  Negative for activity change, appetite change, fatigue and fever.  HENT:  Negative for congestion, ear pain and trouble swallowing.   Eyes:  Negative for pain and visual disturbance.  Respiratory:  Negative for cough, shortness of breath and wheezing.   Cardiovascular:  Negative for chest pain and palpitations.  Gastrointestinal:  Negative for abdominal distention, abdominal pain, blood in stool, constipation, diarrhea, nausea, rectal pain and vomiting.  Endocrine: Negative for polydipsia and polyuria.  Genitourinary:  Negative for dysuria, hematuria and testicular pain.  Musculoskeletal:  Negative for arthralgias and joint swelling.  Skin:  Negative for rash.  Neurological:  Negative for dizziness, syncope and headaches.  Hematological:  Negative for adenopathy.  Psychiatric/Behavioral:  Negative for confusion and dysphoric mood.      Objective:    Physical Exam Constitutional:      General: He is not in acute distress.    Appearance: He is well-developed.  HENT:     Head: Normocephalic and atraumatic.     Comments: He has chronic scarring and very distorted landmarks right eardrum.  Chronic otitis media    Left Ear: External ear normal.  Eyes:     Conjunctiva/sclera: Conjunctivae normal.     Pupils: Pupils are equal, round, and reactive to light.  Neck:     Thyroid: No thyromegaly.  Cardiovascular:     Rate and Rhythm: Normal rate and regular rhythm.     Heart sounds: Normal  heart sounds. No murmur heard. Pulmonary:     Effort: No respiratory distress.     Breath sounds: No wheezing or rales.  Abdominal:     General: Bowel sounds are normal. There is no distension.     Palpations: Abdomen is soft. There is no mass.     Tenderness: There is no abdominal tenderness. There is no guarding or rebound.  Musculoskeletal:     Cervical back: Normal range of motion and neck supple.  Right lower leg: No edema.     Left lower leg: No edema.  Lymphadenopathy:     Cervical: No cervical adenopathy.  Skin:    Findings: No rash.  Neurological:     Mental Status: He is alert and oriented to person, place, and time.     Cranial Nerves: No cranial nerve deficit.     Deep Tendon Reflexes: Reflexes normal.    BP 120/68 (BP Location: Left Arm, Patient Position: Sitting, Cuff Size: Normal)    Pulse 70    Temp 98.1 F (36.7 C) (Oral)    Ht 5' 10.5" (1.791 m)    Wt 190 lb 1.6 oz (86.2 kg)    SpO2 98%    BMI 26.89 kg/m  Wt Readings from Last 3 Encounters:  10/31/21 190 lb 1.6 oz (86.2 kg)  08/04/21 196 lb 11.2 oz (89.2 kg)  01/28/21 202 lb 14.4 oz (92 kg)     Health Maintenance Due  Topic Date Due   COLONOSCOPY (Pts 45-23yrs Insurance coverage will need to be confirmed)  10/15/2019   TETANUS/TDAP  08/27/2020   Pneumonia Vaccine 72+ Years old (2 - PPSV23 if available, else PCV20) 10/24/2020    There are no preventive care reminders to display for this patient.  Lab Results  Component Value Date   TSH 1.70 10/29/2020   Lab Results  Component Value Date   WBC 5.6 10/29/2020   HGB 15.8 10/29/2020   HCT 46.2 10/29/2020   MCV 95.2 10/29/2020   PLT 149.0 (L) 10/29/2020   Lab Results  Component Value Date   NA 140 10/29/2020   K 4.1 10/29/2020   CO2 32 10/29/2020   GLUCOSE 97 10/29/2020   BUN 8 10/29/2020   CREATININE 0.95 10/29/2020   BILITOT 1.3 (H) 10/29/2020   ALKPHOS 53 10/29/2020   AST 19 10/29/2020   ALT 25 10/29/2020   PROT 6.7 10/29/2020    ALBUMIN 4.8 10/29/2020   CALCIUM 9.8 10/29/2020   ANIONGAP 8 03/27/2017   GFR 83.19 10/29/2020   Lab Results  Component Value Date   CHOL 106 10/29/2020   Lab Results  Component Value Date   HDL 32.80 (L) 10/29/2020   Lab Results  Component Value Date   LDLCALC 56 10/29/2020   Lab Results  Component Value Date   TRIG 89.0 10/29/2020   Lab Results  Component Value Date   CHOLHDL 3 10/29/2020   Lab Results  Component Value Date   HGBA1C 5.5 05/13/2016      Assessment & Plan:   Problem List Items Addressed This Visit   None Visit Diagnoses     Physical exam    -  Primary   Relevant Orders   Basic metabolic panel   Lipid panel   CBC with Differential/Platelet   TSH   Hepatic function panel   PSA   Ambulatory referral to Gastroenterology     We discussed the following health maintenance issues  -Placed referral for repeat colonoscopy -Discussed pros and cons of Shingrix vaccine.  He will consider -Pneumovax given -Continue annual flu vaccine -Obtain screening labs as above   Meds ordered this encounter  Medications   rosuvastatin (CRESTOR) 10 MG tablet    Sig: Take 1 tablet (10 mg total) by mouth daily.    Dispense:  90 tablet    Refill:  3   omega-3 acid ethyl esters (LOVAZA) 1 g capsule    Sig: Take 4 capsules by mouth once daily    Dispense:  360 capsule    Refill:  3    Follow-up: No follow-ups on file.    Carolann Littler, MD

## 2021-12-10 ENCOUNTER — Encounter: Payer: Self-pay | Admitting: Family Medicine

## 2021-12-10 DIAGNOSIS — Z122 Encounter for screening for malignant neoplasm of respiratory organs: Secondary | ICD-10-CM

## 2021-12-10 NOTE — Telephone Encounter (Signed)
Have him check with [facility].  Referral for repeat colonoscopy was placed with them back in January ?

## 2021-12-24 ENCOUNTER — Other Ambulatory Visit: Payer: Self-pay

## 2021-12-24 DIAGNOSIS — Z87891 Personal history of nicotine dependence: Secondary | ICD-10-CM

## 2022-01-16 ENCOUNTER — Ambulatory Visit
Admission: RE | Admit: 2022-01-16 | Discharge: 2022-01-16 | Disposition: A | Discharge status: Home / Self Care | Payer: Medicare HMO | Source: Ambulatory Visit | Attending: Acute Care | Admitting: Acute Care

## 2022-01-16 DIAGNOSIS — Z87891 Personal history of nicotine dependence: Secondary | ICD-10-CM

## 2022-01-16 DIAGNOSIS — I700 Atherosclerosis of aorta: Secondary | ICD-10-CM | POA: Diagnosis not present

## 2022-01-16 DIAGNOSIS — J432 Centrilobular emphysema: Secondary | ICD-10-CM | POA: Diagnosis not present

## 2022-01-16 DIAGNOSIS — I2510 Atherosclerotic heart disease of native coronary artery without angina pectoris: Secondary | ICD-10-CM | POA: Diagnosis not present

## 2022-02-03 ENCOUNTER — Ambulatory Visit (INDEPENDENT_AMBULATORY_CARE_PROVIDER_SITE_OTHER): Payer: Medicare HMO

## 2022-02-03 VITALS — BP 118/64 | HR 64 | Temp 97.9°F | Ht 71.0 in | Wt 190.6 lb

## 2022-02-03 DIAGNOSIS — Z0000 Encounter for general adult medical examination without abnormal findings: Secondary | ICD-10-CM | POA: Diagnosis not present

## 2022-02-03 NOTE — Progress Notes (Signed)
?This visit occurred during the SARS-CoV-2 public health emergency.  Safety protocols were in place, including screening questions prior to the visit, additional usage of staff PPE, and extensive cleaning of exam room while observing appropriate contact time as indicated for disinfecting solutions. ? ?Subjective:  ? Steven Pollard is a 66 y.o. male who presents for Medicare Annual/Subsequent preventive examination. ? ?Review of Systems    ? ?Cardiac Risk Factors include: advanced age (>42mn, >>38women);dyslipidemia;male gender ? ?   ?Objective:  ?  ?Today's Vitals  ? 02/03/22 0827  ?BP: 118/64  ?Pulse: 64  ?Temp: 97.9 ?F (36.6 ?C)  ?TempSrc: Oral  ?SpO2: 97%  ?Weight: 190 lb 9.6 oz (86.5 kg)  ?Height: '5\' 11"'$  (1.803 m)  ? ?Body mass index is 26.58 kg/m?. ? ? ?  02/03/2022  ?  8:40 AM 01/28/2021  ?  8:16 AM  ?Advanced Directives  ?Does Patient Have a Medical Advance Directive? Yes Yes  ?Type of [place] of [place]  ?[place]? No - copy requested   ? ? ?Current Medications (verified) ?Outpatient Encounter Medications as of 02/03/2022  ?Medication Sig  ? aspirin 81 MG tablet Take 1 tablet by mouth daily.  ? augmented betamethasone dipropionate (DIPROLENE-AF) 0.05 % ointment Apply topically 2 (two) times daily.  ? celecoxib (CELEBREX) 200 MG capsule TAKE 1 CAPSULE BY MOUTH EVERY DAY  ? cetirizine (ZYRTEC) 10 MG tablet Take 10 mg by mouth as needed.   ? EPINEPHrine (EPIPEN 2-PAK) 0.3 mg/0.3 mL IJ SOAJ injection Inject 0.3 mLs (0.3 mg total) into the muscle once as needed (difficulty breathing, hypotension, oral swelling).  ? famotidine (PEPCID) 20 MG tablet Take 20 mg by mouth as needed for heartburn or indigestion.  ? hydrocortisone 2.5 % cream   ? omega-3 acid ethyl esters (LOVAZA) 1 g capsule Take 4 capsules by mouth once daily  ? rosuvastatin (CRESTOR) 10 MG tablet Take 1 tablet (10 mg total) by mouth daily.  ? ?No  facility-administered encounter medications on file as of 02/03/2022.  ? ? ?Allergies (verified) ?Latex, Oxycodone-acetaminophen, Tyloxapol, and Caffeine  ? ?History: ?Past Medical History:  ?Diagnosis Date  ? Colon polyps   ? GERD (gastroesophageal reflux disease)   ? History of seizure disorder   ? Hyperlipemia   ? Prostate disease   ? Wears glasses   ? ?Past Surgical History:  ?Procedure Laterality Date  ? BACK SURGERY  2018  ? HERNIA REPAIR  1984  ? left  ? perfor[place] eardrum  1995  [place] TONSILLECTOMY  1962  ? ?Family History  ?Problem Relation Age of Onset  ? Arthritis Mother   ? Heart disease Father 62 ?     heart attack  ? ?Social History  ? ?Socioeconomic History  ? Marital status: Married  ?  Spouse name: Not on file  ? Number of children: Not on file  ? Years of education: Not on file  ? Highest education level: Not on file  ?Occupational History  ? Not on file  ?Tobacco Use  ? Smoking status: Former  ?  Packs/day: 1.00  ?  Years: 34.00  ?  Pack years: 34.00  ?  Types: Cigarettes  ?  Quit date: 2008  ?  Years since quitting: 15.3  ? Smokeless tobacco: Never  ? Tobacco comments:  ?  Encouraged to remain smoke free  ?Vaping Use  ? Vaping  Use: Never used  ?Substance and Sexual Activity  ? Alcohol use: Yes  ?  Comment: socially  ? Drug use: No  ? Sexual activity: Not on file  ?Other Topics Concern  ? Not on file  ?Social History Narrative  ? Not on file  ? ?Social Determinants of Health  ? ?Financial Resource Strain: Low Risk   ? Difficulty of Paying Living Expenses: Not hard at all  ?Food Insecurity: No Food Insecurity  ? Worried About Charity fundraiser in the Last Year: Never true  ? Ran Out of Food in the Last Year: Never true  ?Transportation Needs: No Transportation Needs  ? Lack of Transportation (Medical): No  ? Lack of Transportation (Non-Medical): No  ?Physical Activity: Inactive  ? Days of Exercise per Week: 0 days  ? Minutes of Exercise per Session: 0 min  ?Stress: No Stress  Concern Present  ? Feeling of Stress : Not at all  ?Social Connections: Not on file  ? ? ?Tobacco Counseling ?Counseling given: Not Answered ?Tobacco comments: Encouraged to remain smoke free ? ? ?Clinical Intake: ? ?Pre-visit preparation completed: Yes ? ?Pain : No/denies pain ? ?  ? ?Nutritional Status: BMI 25 -29 Overweight ?Nutritional Risks: None ?Diabetes: No ? ?How often do you need to have someone help you when you read instructions, pamphlets, or other written materials from your doctor or pharmacy?: 1 - Never ?What is the last grade level you completed in school?: 12th grade ? ?Diabetic? no ? ?Interpreter Needed?: No ? ?Information entered by :: NAllen LPN ? ? ?Activities of Daily Living ? ?  02/03/2022  ?  8:42 AM  ?In your present state of health, do you have any difficulty performing the following activities:  ?Hearing? 0  ?Comment hearing loss right ear  ?Vision? 0  ?Difficulty concentrating or making decisions? 0  ?Walking or climbing stairs? 0  ?Dressing or bathing? 0  ?Doing errands, shopping? 0  ?Preparing Food and eating ? N  ?Using the Toilet? N  ?In the past six months, have you accidently leaked urine? N  ?Do you have problems with loss of bowel control? N  ?Managing your Medications? N  ?Managing your Finances? N  ?Housekeeping or managing your Housekeeping? N  ? ? ?Patient Care Team: ?[name], MD as PCP - General (Family Medicine) ? ?Indicate any recent Medical Services you may have received from other than [facility] providers in the past year (date may be approximate). ? ?   ?Assessment:  ? This is a routine wellness examination for Steven Pollard. ? ?Hearing/Vision screen ?Vision Screening - Comments:: No regular eye exams, ? ?Dietary issues and exercise activities discussed: ?Current Exercise Habits: The patient does not participate in regular exercise at present ? ? Goals Addressed   ? ?  ?  ?  ?  ? This Visit's Progress  ?  Patient Stated     ?  02/03/2022, no goals ?  ? ?  ? ?Depression  Screen ? ?  02/03/2022  ?  8:42 AM 10/31/2021  ?  8:30 AM 01/28/2021  ?  8:14 AM 10/25/2019  ?  7:45 AM 10/25/2018  ?  8:38 AM 01/11/2017  ?  8:28 AM  ?PHQ 2/9 Scores  ?PHQ - 2 Score 0 0 1 0 0 0  ?PHQ- 9 Score    0    ?  ?Fall Risk ? ?  02/03/2022  ?  8:41 AM 01/28/2021  ?  8:17 AM 10/25/2019  ?  7:58 AM 10/25/2019  ?  7:46 AM 01/11/2017  ?  8:28 AM  ?Fall Risk   ?Falls in the past year? 0 0 0 0 No  ?Number falls in past yr: 0 0 0    ?Injury with Fall? 0 0     ?Risk for fall due to : Medication side effect Impaired vision No Fall Risks    ?Follow up Falls evaluation completed;Education provided;Falls prevention discussed Falls prevention discussed     ? ? ?FALL RISK PREVENTION PERTAINING TO THE HOME: ? ?Any stairs in or around the home? Yes  ?If so, are there any without handrails? No  ?Home free of loose throw rugs in walkways, pet beds, electrical cords, etc? Yes  ?Adequate lighting in your home to reduce risk of falls? Yes  ? ?ASSISTIVE DEVICES UTILIZED TO PREVENT FALLS: ? ?Life alert? No  ?Use of a cane, walker or w/c? No  ?Grab bars in the bathroom? Yes  ?Shower chair or bench in shower? Yes  ?Elevated toilet seat or a handicapped toilet? Yes  ? ?TIMED UP AND GO: ? ?Was the test performed? No .  ? ? ?Gait steady and fast without use of assistive device ? ?Cognitive Function: ?  ?  ? ?  02/03/2022  ?  8:44 AM 01/28/2021  ?  8:20 AM  ?6CIT Screen  ?What Year? 0 points 0 points  ?What month? 0 points 0 points  ?What time? 0 points   ?Count back from 20 0 points 0 points  ?Months in reverse 0 points 2 points  ?Repeat phrase 0 points 0 points  ?Total Score 0 points   ? ? ?Immunizations ?Immunization History  ?Administered Date(s) Administered  ? Fluad Quad(high Dose 65+) 08/29/2019, 08/04/2021  ? Influenza Split 10/12/2006, 07/12/2010  ? Influenza,inj,Quad PF,6+ Mos 09/26/2014, 10/02/2015, 07/10/2016, 10/22/2017, 10/25/2018  ? PFIZER(Purple Top)SARS-COV-2 Vaccination 09/27/2020  ? Pension scheme manager 37yr &  up 08/12/2021  ? Pneumococcal Conjugate-13 10/25/2019  ? Pneumococcal Polysaccharide-23 10/31/2021  ? Tdap 07/30/2010  ? Zoster, Live 10/02/2015  ? ? ?TDAP status: Due, Education has been provided regardi

## 2022-02-03 NOTE — Patient Instructions (Signed)
Steven Pollard , ?Thank you for taking time to come for your Medicare Wellness Visit. I appreciate your ongoing commitment to your health goals. Please review the following plan we discussed and let me know if I can assist you in the future.  ? ?Screening recommendations/referrals: ?Colonoscopy: scheduled 04/16/2022 ?Recommended yearly ophthalmology/optometry visit for glaucoma screening and checkup ?Recommended yearly dental visit for hygiene and checkup ? ?Vaccinations: ?Influenza vaccine: due 05/12/2022 ?Pneumococcal vaccine: completed 10/31/2021 ?Tdap vaccine: due ?Shingles vaccine: discussed   ?Covid-19:  08/12/2021, 09/27/2020, 12/11/2019, 11/18/2019 ? ?Advanced directives: Please bring a copy of your POA (Power of Attorney) and/or Living Will to your next appointment.  ? ?Conditions/risks identified: none ? ?Next appointment: Follow up in one year for your annual wellness visit.  ? ?Preventive Care 24 Years and Older, Male ?Preventive care refers to lifestyle choices and visits with your health care provider that can promote health and wellness. ?What does preventive care include? ?A yearly physical exam. This is also called an annual well check. ?Dental exams once or twice a year. ?Routine eye exams. Ask your health care provider how often you should have your eyes checked. ?Personal lifestyle choices, including: ?Daily care of your teeth and gums. ?Regular physical activity. ?Eating a healthy diet. ?Avoiding tobacco and drug use. ?Limiting alcohol use. ?Practicing safe sex. ?Taking low doses of aspirin every day. ?Taking vitamin and mineral supplements as recommended by your health care provider. ?What happens during an annual well check? ?The services and screenings done by your health care provider during your annual well check will depend on your age, overall health, lifestyle risk factors, and family history of disease. ?Counseling  ?Your health care provider may ask you questions about your: ?Alcohol use. ?Tobacco  use. ?Drug use. ?Emotional well-being. ?Home and relationship well-being. ?Sexual activity. ?Eating habits. ?History of falls. ?Memory and ability to understand (cognition). ?Work and work Statistician. ?Screening  ?You may have the following tests or measurements: ?Height, weight, and BMI. ?Blood pressure. ?Lipid and cholesterol levels. These may be checked every 5 years, or more frequently if you are over 68 years old. ?Skin check. ?Lung cancer screening. You may have this screening every year starting at age 6 if you have a 30-pack-year history of smoking and currently smoke or have quit within the past 15 years. ?Fecal occult blood test (FOBT) of the stool. You may have this test every year starting at age 76. ?Flexible sigmoidoscopy or colonoscopy. You may have a sigmoidoscopy every 5 years or a colonoscopy every 10 years starting at age 4. ?Prostate cancer screening. Recommendations will vary depending on your family history and other risks. ?Hepatitis C blood test. ?Hepatitis B blood test. ?Sexually transmitted disease (STD) testing. ?Diabetes screening. This is done by checking your blood sugar (glucose) after you have not eaten for a while (fasting). You may have this done every 1-3 years. ?Abdominal aortic aneurysm (AAA) screening. You may need this if you are a current or former smoker. ?Osteoporosis. You may be screened starting at age 37 if you are at high risk. ?Talk with your health care provider about your test results, treatment options, and if necessary, the need for more tests. ?Vaccines  ?Your health care provider may recommend certain vaccines, such as: ?Influenza vaccine. This is recommended every year. ?Tetanus, diphtheria, and acellular pertussis (Tdap, Td) vaccine. You may need a Td booster every 10 years. ?Zoster vaccine. You may need this after age 46. ?Pneumococcal 13-valent conjugate (PCV13) vaccine. One dose is recommended after age  65. ?Pneumococcal polysaccharide (PPSV23) vaccine.  One dose is recommended after age 41. ?Talk to your health care provider about which screenings and vaccines you need and how often you need them. ?This information is not intended to replace advice given to you by your health care provider. Make sure you discuss any questions you have with your health care provider. ?Document Released: 10/25/2015 Document Revised: 06/17/2016 Document Reviewed: 07/30/2015 ?Elsevier Interactive Patient Education ? 2017 [place]. ? ?Fall Prevention in the Home ?Falls can cause injuries. They can happen to people of all ages. There are many things you can do to make your home safe and to help prevent falls. ?What can I do on the outside of my home? ?Regularly fix the edges of walkways and driveways and fix any cracks. ?Remove anything that might make you trip as you walk through a door, such as a raised step or threshold. ?Trim any bushes or trees on the path to your home. ?Use bright outdoor lighting. ?Clear any walking paths of anything that might make someone trip, such as rocks or tools. ?Regularly check to see if handrails are loose or broken. Make sure that both sides of any steps have handrails. ?Any raised decks and porches should have guardrails on the edges. ?Have any leaves, snow, or ice cleared regularly. ?Use sand or salt on walking paths during winter. ?Clean up any spills in your garage right away. This includes oil or grease spills. ?What can I do in the bathroom? ?Use night lights. ?Install grab bars by the toilet and in the tub and shower. Do not use towel bars as grab bars. ?Use non-skid mats or decals in the tub or shower. ?If you need to sit down in the shower, use a plastic, non-slip stool. ?Keep the floor dry. Clean up any water that spills on the floor as soon as it happens. ?Remove soap buildup in the tub or shower regularly. ?Attach bath mats securely with double-sided non-slip rug tape. ?Do not have throw rugs and other things on the floor that can make  you trip. ?What can I do in the bedroom? ?Use night lights. ?Make sure that you have a light by your bed that is easy to reach. ?Do not use any sheets or blankets that are too big for your bed. They should not hang down onto the floor. ?Have a firm chair that has side arms. You can use this for support while you get dressed. ?Do not have throw rugs and other things on the floor that can make you trip. ?What can I do in the kitchen? ?Clean up any spills right away. ?Avoid walking on wet floors. ?Keep items that you use a lot in easy-to-reach places. ?If you need to reach something above you, use a strong step stool that has a grab bar. ?Keep electrical cords out of the way. ?Do not use floor polish or wax that makes floors slippery. If you must use wax, use non-skid floor wax. ?Do not have throw rugs and other things on the floor that can make you trip. ?What can I do with my stairs? ?Do not leave any items on the stairs. ?Make sure that there are handrails on both sides of the stairs and use them. Fix handrails that are broken or loose. Make sure that handrails are as long as the stairways. ?Check any carpeting to make sure that it is firmly attached to the stairs. Fix any carpet that is loose or worn. ?Avoid having throw rugs at  the top or bottom of the stairs. If you do have throw rugs, attach them to the floor with carpet tape. ?Make sure that you have a light switch at the top of the stairs and the bottom of the stairs. If you do not have them, ask someone to add them for you. ?What else can I do to help prevent falls? ?Wear shoes that: ?Do not have high heels. ?Have rubber bottoms. ?Are comfortable and fit you well. ?Are closed at the toe. Do not wear sandals. ?If you use a stepladder: ?Make sure that it is fully opened. Do not climb a closed stepladder. ?Make sure that both sides of the stepladder are locked into place. ?Ask someone to hold it for you, if possible. ?Clearly mark and make sure that you can  see: ?Any grab bars or handrails. ?First and last steps. ?Where the edge of each step is. ?Use tools that help you move around (mobility aids) if they are needed. These include: ?Canes. ?Walkers. ?Scooters. ?C

## 2022-02-12 ENCOUNTER — Telehealth: Payer: Self-pay | Admitting: Acute Care

## 2022-02-12 DIAGNOSIS — R911 Solitary pulmonary nodule: Secondary | ICD-10-CM

## 2022-02-12 DIAGNOSIS — Z87891 Personal history of nicotine dependence: Secondary | ICD-10-CM

## 2022-02-12 NOTE — Telephone Encounter (Signed)
Order placed for 6 month follow up CT.  Results/plan faxed to PCP ?

## 2022-02-12 NOTE — Telephone Encounter (Signed)
I have called the patient with the results of his low dose CT Chest. His scan was read as a Lung RADS 2: nodules that are benign in appearance and behavior with a very low likelihood of becoming a clinically active cancer due to size or lack of growth. Recommendation per radiology is for a repeat LDCT in 12 months.  ?However there was a new 17.2 gg area in his left upper lobe. There is notation that this could be infectious or inflammatory per radiology. He did have Covid 04/2021. ? If this notation could be scarring due to Covid. I reviewed the scan with Dr. Darnell Level, and she felt a 6 month follow up was appropriate to monitor this area more closely.  Patient is in agreement with this plan. ? ?Denise, please place order for 6 month follow up LDCT. If it is not approved per Insurance, then we will change to CT Chest without.  ? ?Please fax results to PCP and place order for 6 month follow up. Thank You!! ?

## 2022-02-16 DIAGNOSIS — Z0100 Encounter for examination of eyes and vision without abnormal findings: Secondary | ICD-10-CM | POA: Diagnosis not present

## 2022-02-16 DIAGNOSIS — H524 Presbyopia: Secondary | ICD-10-CM | POA: Diagnosis not present

## 2022-04-13 IMAGING — CT CT CHEST LUNG CANCER SCREENING LOW DOSE W/O CM
1 series · 10 of 10 positions shown, 13 images · non-contrast
Comparison: CT lung cancer screening dated September 20, 2020

CLINICAL DATA: Former smoker with 34 pack-year history



[ct lung segmentation data · axial · 0.83mm/px · z∈[+380,+380]mm · 10 of 357 frames shown]
[frame 1/357  mediastinal]
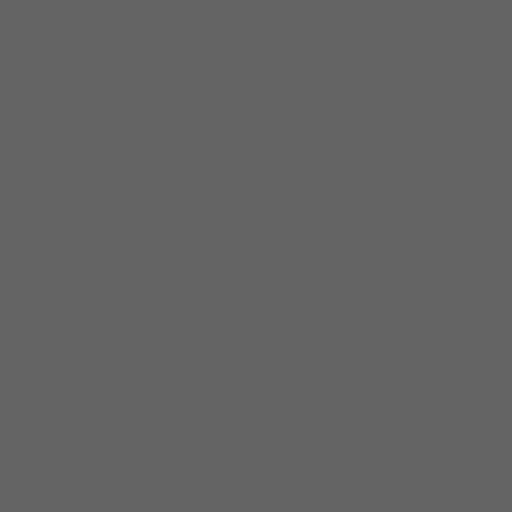
[frame 1/357  lung]
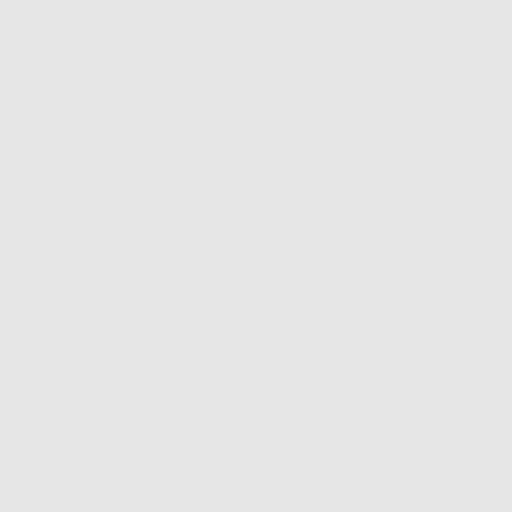
[frame 40/357  lung]
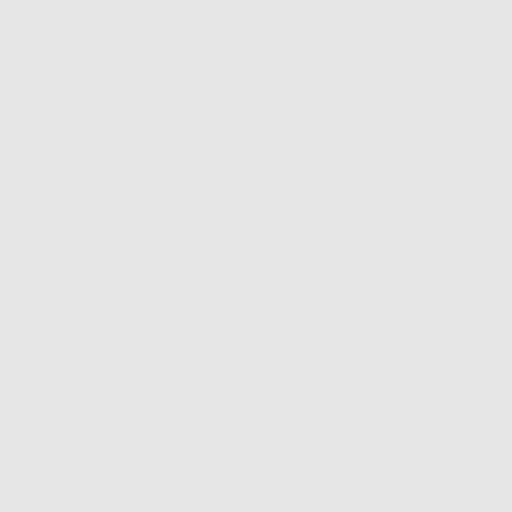
[frame 80/357  lung]
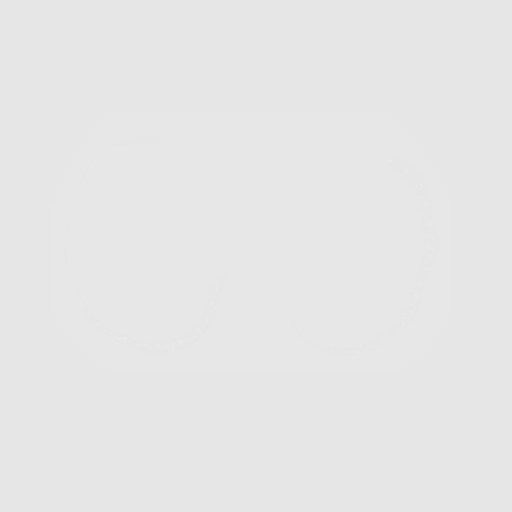
[frame 119/357  lung]
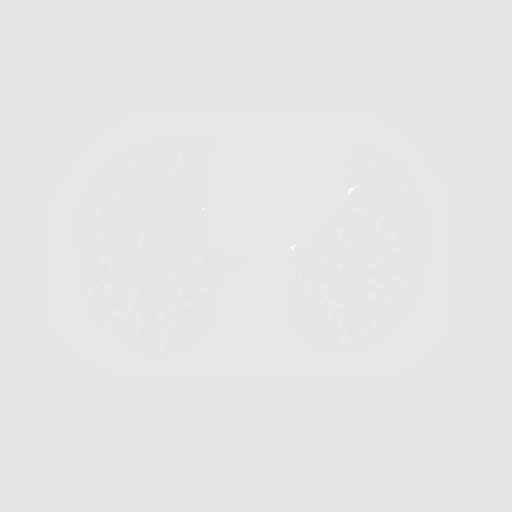
[frame 159/357  mediastinal]
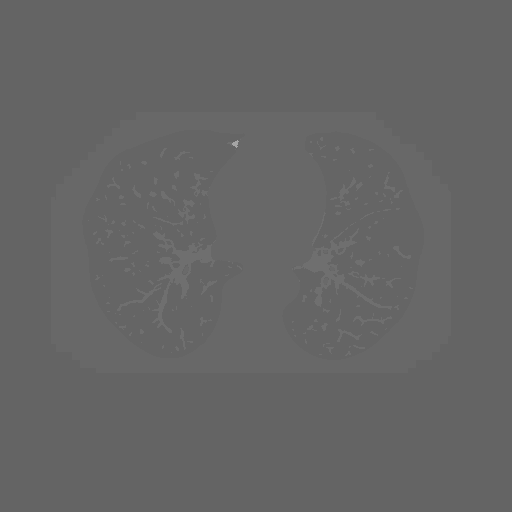
[frame 159/357  lung]
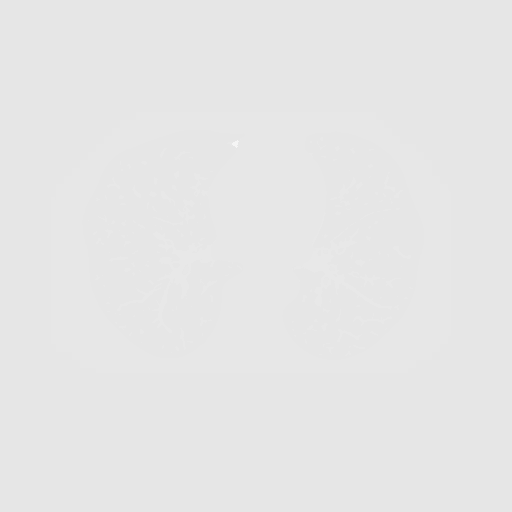
[frame 198/357  lung]
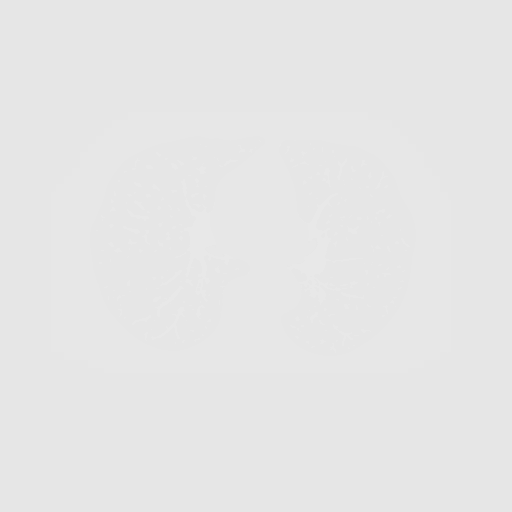
[frame 238/357  lung]
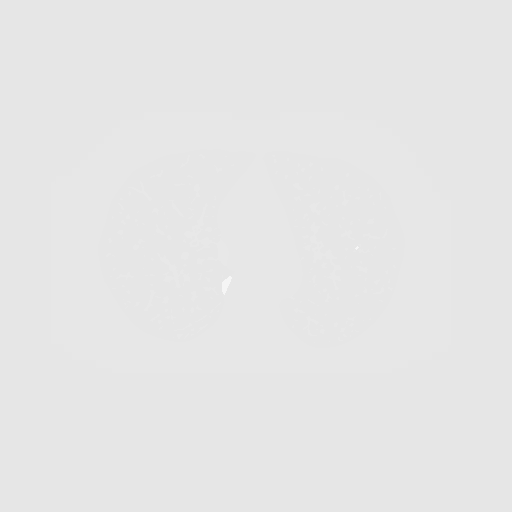
[frame 277/357  lung]
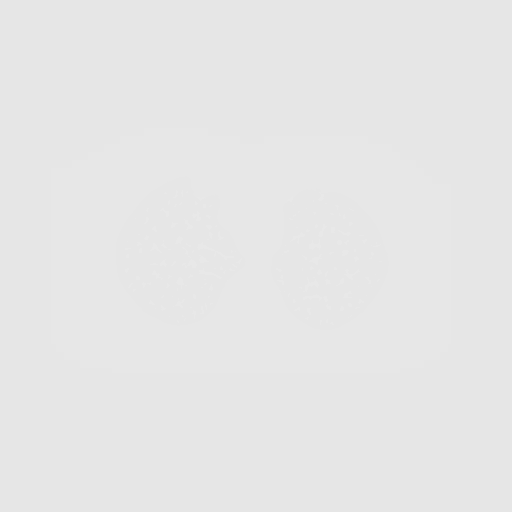
[frame 317/357  mediastinal]
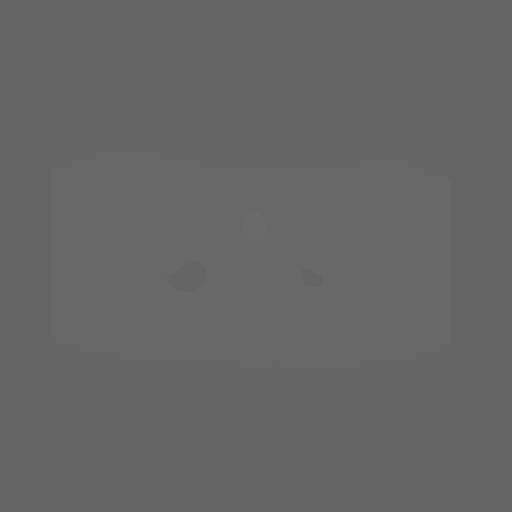
[frame 317/357  lung]
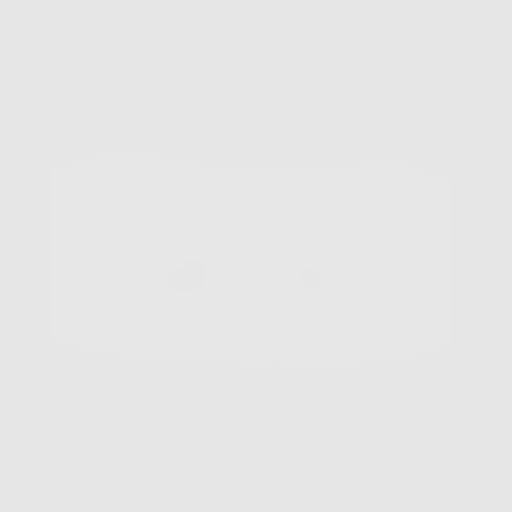
[frame 357/357  lung]
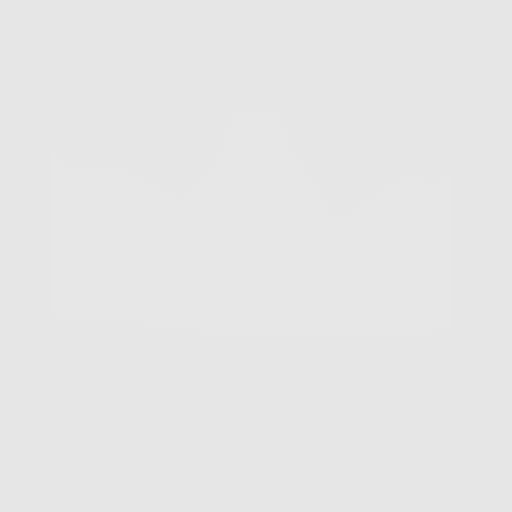

[10 of 10 positions shown; findings below may reference images not displayed]

FINDINGS: Cardiovascular: Normal heart size. No pericardial effusion. Left
main and three-vessel coronary artery calcifications.
Atherosclerotic disease of the thoracic aorta.

Mediastinum/Nodes: Esophagus and thyroid are unremarkable. No
pathologically enlarged lymph nodes seen in the chest.

Lungs/Pleura: Central airways are patent. Mild centrilobular
emphysema. No consolidation, pleural effusion or pneumothorax. New
ground-glass nodule of the left upper lobe measuring 17.2 mm in mean
diameter, potentially infectious or inflammatory. No new suspicious
pulmonary nodules.

Upper Abdomen: No acute abnormality.

Musculoskeletal: No chest wall mass or suspicious bone lesions
identified.
IMPRESSION: 1. Lung-RADS 2, benign appearance or behavior. Continue annual
screening with low-dose chest CT without contrast in 12 months.
2. Aortic Atherosclerosis (63BAL-664.4) and Emphysema (63BAL-040.F).

## 2022-04-16 DIAGNOSIS — Z1211 Encounter for screening for malignant neoplasm of colon: Secondary | ICD-10-CM | POA: Diagnosis not present

## 2022-04-16 LAB — HM COLONOSCOPY

## 2022-05-04 ENCOUNTER — Encounter: Payer: Self-pay | Admitting: Family Medicine

## 2022-08-17 ENCOUNTER — Encounter: Payer: Self-pay | Admitting: Family Medicine

## 2022-08-17 ENCOUNTER — Ambulatory Visit (INDEPENDENT_AMBULATORY_CARE_PROVIDER_SITE_OTHER): Payer: Medicare HMO | Admitting: Family Medicine

## 2022-08-17 VITALS — BP 116/64 | HR 67 | Temp 97.9°F | Ht 71.0 in | Wt 198.7 lb

## 2022-08-17 DIAGNOSIS — H66001 Acute suppurative otitis media without spontaneous rupture of ear drum, right ear: Secondary | ICD-10-CM

## 2022-08-17 DIAGNOSIS — Z23 Encounter for immunization: Secondary | ICD-10-CM | POA: Diagnosis not present

## 2022-08-17 MED ORDER — CIPROFLOXACIN-DEXAMETHASONE 0.3-0.1 % OT SUSP: 4.0000 [drp] | Freq: Two times a day (BID) | OTIC | 0 refills | Status: AC

## 2022-08-17 MED ORDER — AMOXICILLIN-POT CLAVULANATE 875-125 MG PO TABS: 1.0000 | ORAL_TABLET | Freq: Two times a day (BID) | ORAL | 0 refills | Status: DC

## 2022-08-17 NOTE — Addendum Note (Signed)
Addended by: Nilda Riggs on: 08/17/2022 10:17 AM   Modules accepted: Orders

## 2022-08-17 NOTE — Progress Notes (Signed)
Established Patient Office Visit  Subjective   Patient ID: Steven Pollard, male    DOB: March 22, 1956  Age: 66 y.o. MRN: [id number]  Chief Complaint  Patient presents with   Ear Pain    Patient complains of right ear pain. X1 week,     HPI   Seen with 1 week history of mild right ear discomfort.  Muffled sensation.  He has had some chronic tinnitus which is unchanged.  No acute hearing changes.  No recent change of altitude.  No drainage.  Has had problems in the same year previously.  No major nasal congestion.  No fevers or chills.  No vertigo.  Past Medical History:  Diagnosis Date   Colon polyps    GERD (gastroesophageal reflux disease)    History of seizure disorder    Hyperlipemia    Prostate disease    Wears glasses    Past Surgical History:  Procedure Laterality Date   BACK SURGERY  2018   HERNIA REPAIR  1984   left   perforated eardrum  [place]    reports that he quit smoking about 15 years ago. His smoking use included cigarettes. He has a 34.00 pack-year smoking history. He has never used smokeless tobacco. He reports current alcohol use. He reports that he does not use drugs. family history includes Arthritis in his mother; Heart disease (age of onset: 55) in his father. Allergies  Allergen Reactions   Latex     Unknown    Oxycodone-Acetaminophen     dizzines    Tyloxapol Other (See Comments)    vertigo   Caffeine Rash    Review of Systems  Constitutional:  Negative for chills and fever.  HENT:  Negative for congestion and ear discharge.   Respiratory:  Negative for cough.       Objective:     BP 116/64 (BP Location: Left Arm, Patient Position: Sitting, Cuff Size: Large)   Pulse 67   Temp 97.9 F (36.6 C) (Oral)   Ht '5\' 11"'$  (1.803 m)   Wt 198 lb 11.2 oz (90.1 kg)   SpO2 98%   BMI 27.71 kg/m    Physical Exam Vitals reviewed.  Constitutional:      General: He is not in acute distress.     Appearance: He is not ill-appearing.  HENT:     Ears:     Comments: Left TM is normal.  Right TM is distorted.  He has moderate erythema.  He has suppurative changes noted.  No obvious perforation. Cardiovascular:     Rate and Rhythm: Normal rate and regular rhythm.  Neurological:     Mental Status: He is alert.      No results found for any visits on 08/17/22.    The ASCVD Risk score (Arnett DK, et al., 2019) failed to calculate for the following reasons:   The valid total cholesterol range is 130 to 320 mg/dL    Assessment & Plan:   Problem List Items Addressed This Visit   None Visit Diagnoses     Non-recurrent acute suppurative otitis media of right ear without spontaneous rupture of tympanic membrane    -  Primary   Relevant Medications   amoxicillin-clavulanate (AUGMENTIN) 875-125 MG tablet     -Start Augmentin 875 mg twice daily for 10 days -Patient requesting refill of Ciprodex eardrops in case he has any drainage or evidence for perforation.  This was  written -Follow-up for any persistent or worsening symptoms -Flu vaccine given  No follow-ups on file.    Carolann Littler, MD

## 2022-08-31 ENCOUNTER — Ambulatory Visit
Admission: RE | Admit: 2022-08-31 | Discharge: 2022-08-31 | Disposition: A | Discharge status: Home / Self Care | Payer: Medicare HMO | Source: Ambulatory Visit | Attending: Acute Care | Admitting: Acute Care

## 2022-08-31 DIAGNOSIS — R911 Solitary pulmonary nodule: Secondary | ICD-10-CM | POA: Diagnosis not present

## 2022-08-31 DIAGNOSIS — Z87891 Personal history of nicotine dependence: Secondary | ICD-10-CM

## 2022-08-31 DIAGNOSIS — J439 Emphysema, unspecified: Secondary | ICD-10-CM | POA: Diagnosis not present

## 2022-08-31 DIAGNOSIS — I700 Atherosclerosis of aorta: Secondary | ICD-10-CM | POA: Diagnosis not present

## 2022-09-17 ENCOUNTER — Telehealth: Payer: Self-pay | Admitting: Acute Care

## 2022-09-17 ENCOUNTER — Other Ambulatory Visit: Payer: Self-pay

## 2022-09-17 DIAGNOSIS — Z87891 Personal history of nicotine dependence: Secondary | ICD-10-CM

## 2022-09-17 DIAGNOSIS — Z122 Encounter for screening for malignant neoplasm of respiratory organs: Secondary | ICD-10-CM

## 2022-09-17 NOTE — Telephone Encounter (Signed)
Results faxed to PCP.  New order placed for annual 2024 LDCT

## 2022-09-17 NOTE — Telephone Encounter (Signed)
This ground glass area is shrinking. It has shrunk from 17.2 mm to 13.7 mm , it is non-solid, which they suspected was inflammatory on the 01/2022 scan. Annual screening should be fine for this patient. Thanks [employer]

## 2022-09-21 DIAGNOSIS — L821 Other seborrheic keratosis: Secondary | ICD-10-CM | POA: Diagnosis not present

## 2022-09-21 DIAGNOSIS — D1801 Hemangioma of skin and subcutaneous tissue: Secondary | ICD-10-CM | POA: Diagnosis not present

## 2022-09-21 DIAGNOSIS — L308 Other specified dermatitis: Secondary | ICD-10-CM | POA: Diagnosis not present

## 2022-09-21 DIAGNOSIS — L812 Freckles: Secondary | ICD-10-CM | POA: Diagnosis not present

## 2022-09-22 DIAGNOSIS — H4322 Crystalline deposits in vitreous body, left eye: Secondary | ICD-10-CM | POA: Diagnosis not present

## 2022-09-22 DIAGNOSIS — H5203 Hypermetropia, bilateral: Secondary | ICD-10-CM | POA: Diagnosis not present

## 2022-09-22 DIAGNOSIS — H524 Presbyopia: Secondary | ICD-10-CM | POA: Diagnosis not present

## 2022-09-22 DIAGNOSIS — H10413 Chronic giant papillary conjunctivitis, bilateral: Secondary | ICD-10-CM | POA: Diagnosis not present

## 2022-10-19 ENCOUNTER — Ambulatory Visit (INDEPENDENT_AMBULATORY_CARE_PROVIDER_SITE_OTHER): Payer: Medicare HMO

## 2022-10-19 ENCOUNTER — Ambulatory Visit
Admission: RE | Admit: 2022-10-19 | Discharge: 2022-10-19 | Disposition: A | Discharge status: Home / Self Care | Payer: Medicare HMO | Source: Ambulatory Visit | Attending: Family Medicine | Admitting: Family Medicine

## 2022-10-19 VITALS — BP 161/89 | HR 72 | Temp 98.1°F | Resp 14

## 2022-10-19 DIAGNOSIS — M5440 Lumbago with sciatica, unspecified side: Secondary | ICD-10-CM | POA: Diagnosis not present

## 2022-10-19 DIAGNOSIS — M47816 Spondylosis without myelopathy or radiculopathy, lumbar region: Secondary | ICD-10-CM | POA: Diagnosis not present

## 2022-10-19 DIAGNOSIS — M5416 Radiculopathy, lumbar region: Secondary | ICD-10-CM | POA: Diagnosis not present

## 2022-10-19 DIAGNOSIS — M545 Low back pain, unspecified: Secondary | ICD-10-CM | POA: Diagnosis not present

## 2022-10-19 MED ORDER — OXYCODONE-ACETAMINOPHEN 5-325 MG PO TABS: 1.0000 | ORAL_TABLET | Freq: Four times a day (QID) | ORAL | 0 refills | Status: DC | PRN

## 2022-10-19 MED ORDER — PREDNISONE 10 MG (21) PO TBPK: ORAL_TABLET | Freq: Every day | ORAL | 0 refills | Status: DC

## 2022-10-19 NOTE — ED Provider Notes (Signed)
Steven Pollard [facility]    CSN: [id number] Arrival date & time: 10/19/22  1149      History   Chief Complaint Chief Complaint  Patient presents with   Back Pain    Severe right back pain with radiation down right leg for 2 days. Same as before discectomy in 2018. - Entered by patient    HPI Steven Pollard is a 67 y.o. male.   HPI 67 year old male presents with a right hip/right leg pain that began several weeks ago patient reports radiating right back pain PMH significant for seizure, HLD, and aortic atherosclerosis.  Patient is accompanied by his wife this afternoon and reports taking oxycodone tablet from 6 years ago to help with pain although he felt nothing from taking this older prescribed medicine.  Patient reports taking oxycodone without adverse reactions in the past. Patient lying on his left side on exam table due to pain upon entering room.  Past Medical History:  Diagnosis Date   Colon polyps    GERD (gastroesophageal reflux disease)    History of seizure disorder    Hyperlipemia    Prostate disease    Wears glasses     Patient Active Problem List   Diagnosis Date Noted   Aortic atherosclerosis ([place]) 10/29/2020   Allergic reaction to alpha-gal 10/25/2019   Anxiety state 07/11/2016   Insomnia 07/11/2016   Increased prostate specific antigen (PSA) velocity 05/13/2016   GERD (gastroesophageal reflux disease) 03/27/2013   History of kidney stones 03/27/2013   History of seizure 03/27/2013   Dyslipidemia 03/27/2013    Past Surgical History:  Procedure Laterality Date   BACK SURGERY  2018   [place]   left   perforated eardrum  [place] Medications    Prior to Admission medications   Medication Sig Start Date End Date Taking? Authorizing Provider  oxyCODONE-acetaminophen (PERCOCET/ROXICET) 5-325 MG tablet Take 1 tablet by mouth every 6 (six) hours as needed for severe pain. 10/19/22  Yes  Eliezer Lofts, FNP  predniSONE (STERAPRED UNI-PAK 21 TAB) 10 MG (21) TBPK tablet Take by mouth daily. Take 6 tabs by mouth daily  for 2 days, then 5 tabs for 2 days, then 4 tabs for 2 days, then 3 tabs for 2 days, 2 tabs for 2 days, then 1 tab by mouth daily for 2 days 10/19/22  Yes Eliezer Lofts, FNP  aspirin 81 MG tablet Take 1 tablet by mouth daily.    [provider]  augmented betamethasone dipropionate (DIPROLENE-AF) 0.05 % ointment Apply topically 2 (two) times daily.    [provider]  celecoxib (CELEBREX) 200 MG capsule TAKE 1 CAPSULE BY MOUTH EVERY DAY 09/29/21   Burchette, Alinda Sierras, MD  cetirizine (ZYRTEC) 10 MG tablet Take 10 mg by mouth as needed.     [provider]  ciprofloxacin-dexamethasone (CIPRODEX) OTIC suspension Place 4 drops into the right ear 2 (two) times daily. 08/17/22   Burchette, Alinda Sierras, MD  EPINEPHrine (EPIPEN 2-PAK) 0.3 mg/0.3 mL IJ SOAJ injection Inject 0.3 mLs (0.3 mg total) into the muscle once as needed (difficulty breathing, hypotension, oral swelling). 03/27/17   Julianne Rice, MD  famotidine (PEPCID) 20 MG tablet Take 20 mg by mouth as needed for heartburn or indigestion.    [provider]  hydrocortisone 2.5 % cream  10/02/19   [provider]  omega-3 acid ethyl esters (LOVAZA)  1 g capsule Take 4 capsules by mouth once daily 10/31/21   Burchette, Alinda Sierras, MD  rosuvastatin (CRESTOR) 10 MG tablet Take 1 tablet (10 mg total) by mouth daily. 10/31/21   Burchette, Alinda Sierras, MD    Family History Family History  Problem Relation Age of Onset   Arthritis Mother    Heart disease Father 13       heart attack    Social History Social History   Tobacco Use   Smoking status: Former    Packs/day: 1.00    Years: 34.00    Total pack years: 34.00    Types: Cigarettes    Quit date: 2008    Years since quitting: 16.0   Smokeless tobacco: Never   Tobacco comments:    Encouraged to remain smoke free  Vaping Use    Vaping Use: Never used  Substance Use Topics   Alcohol use: Yes    Comment: socially   Drug use: No     Allergies   Latex, Oxycodone-acetaminophen, Tyloxapol, and Caffeine   Review of Systems Review of Systems  Musculoskeletal:  Positive for back pain.     Physical Exam Triage Vital Signs ED Triage Vitals  Enc Vitals Group     BP 10/19/22 1158 (!) 161/89     Pulse Rate 10/19/22 1158 72     Resp 10/19/22 1158 14     Temp 10/19/22 1158 98.1 F (36.7 C)     Temp Source 10/19/22 1158 Oral     SpO2 10/19/22 1158 97 %     Weight --      Height --      Head Circumference --      Peak Flow --      Pain Score 10/19/22 1157 9     Pain Loc --      Pain Edu? --      Excl. in [place]? --    No data found.  Updated Vital Signs BP (!) 161/89 (BP Location: Right Arm)   Pulse 72   Temp 98.1 F (36.7 C) (Oral)   Resp 14   SpO2 97%   Visual Acuity Right Eye Distance:   Left Eye Distance:   Bilateral Distance:    Right Eye Near:   Left Eye Near:    Bilateral Near:     Physical Exam Vitals and nursing note reviewed.  Constitutional:      General: He is not in acute distress.    Appearance: He is obese. He is not ill-appearing.  HENT:     Head: Normocephalic and atraumatic.     Mouth/Throat:     Mouth: Mucous membranes are moist.     Pharynx: Oropharynx is clear.  Eyes:     Extraocular Movements: Extraocular movements intact.     Conjunctiva/sclera: Conjunctivae normal.     Pupils: Pupils are equal, round, and reactive to light.  Cardiovascular:     Rate and Rhythm: Normal rate and regular rhythm.     Pulses: Normal pulses.     Heart sounds: Normal heart sounds.  Pulmonary:     Effort: Pulmonary effort is normal.     Breath sounds: Normal breath sounds. No wheezing, rhonchi or rales.  Musculoskeletal:        General: Normal range of motion.     Cervical back: Normal range of motion and neck supple.     Comments: Right sided lumbar spine TTP over paraspinous  muscles and superior spinal erectors, patient reports radicular  pain radiating from right buttocks down the right leg during exam laying on exam table due to pain  Skin:    General: Skin is warm and dry.  Neurological:     General: No focal deficit present.     Mental Status: He is alert and oriented to person, place, and time.      UC Treatments / Results  Labs (all labs ordered are listed, but only abnormal results are displayed) Labs Reviewed - No data to display  EKG   Radiology DG Lumbar Spine Complete  Result Date: 10/19/2022 CLINICAL DATA:  Right-sided low back pain radiating into the right leg for the past 3 weeks. EXAM: LUMBAR SPINE - COMPLETE 4+ VIEW COMPARISON:  MRI lumbar spine dated July 05, 2018. FINDINGS: Five lumbar type vertebral bodies. No acute fracture or subluxation. Vertebral body heights are preserved. Alignment is normal. Mild disc height loss and facet arthropathy from L2-L3 through L5-S1. The sacroiliac joints are unremarkable. IMPRESSION: 1. Mild multilevel lumbar spondylosis. Electronically Signed   By: Titus Dubin M.D.   On: 10/19/2022 12:49    Procedures Procedures (including critical care time)  Medications Ordered in UC Medications - No data to display  Initial Impression / Assessment and Plan / UC Course  I have reviewed the triage vital signs and the nursing notes.  Pertinent labs & imaging results that were available during my care of the patient were reviewed by me and considered in my medical decision making (see chart for details).     MDM: 1.  Bilateral low back pain with sciatica, sciatica laterally unspecified-lumbar spine x-ray revealed above, Rx'd Sterapred Unipak, Rx'd oxycodone (patient reports taking this medication previously without adverse reactions). Advised patient to take medication as directed with food to completion.  Advised patient may use oxycodone for breakthrough lower back pain only.  Advised patient if symptoms  worsen and/or unresolved please follow-up with PCP or [facility] orthopedic provider for further evaluation, contact information has been provided below.  Discharged home, hemodynamically stable. Final Clinical Impressions(s) / UC Diagnoses   Final diagnoses:  Bilateral low back pain with sciatica, sciatica laterality unspecified, unspecified chronicity     Discharge Instructions      Advised patient to take medication as directed with food to completion.  Advised patient may use oxycodone for breakthrough lower back pain only.  Advised patient if symptoms worsen and/or unresolved please follow-up with PCP or [facility] orthopedic provider for further evaluation, contact information has been provided below.     ED Prescriptions     Medication Sig Dispense Auth. Provider   predniSONE (STERAPRED UNI-PAK 21 TAB) 10 MG (21) TBPK tablet Take by mouth daily. Take 6 tabs by mouth daily  for 2 days, then 5 tabs for 2 days, then 4 tabs for 2 days, then 3 tabs for 2 days, 2 tabs for 2 days, then 1 tab by mouth daily for 2 days 42 tablet Eliezer Lofts, FNP   oxyCODONE-acetaminophen (PERCOCET/ROXICET) 5-325 MG tablet Take 1 tablet by mouth every 6 (six) hours as needed for severe pain. 15 tablet Eliezer Lofts, FNP      I have reviewed the PDMP during this encounter.   Eliezer Lofts, [place] 10/19/22 1322

## 2022-10-19 NOTE — ED Triage Notes (Signed)
Pt presents with rt hip and leg pain that began "several weeks" ago.

## 2022-10-19 NOTE — Discharge Instructions (Addendum)
Advised patient to take medication as directed with food to completion.  Advised patient may use oxycodone for breakthrough lower back pain only.  Advised patient if symptoms worsen and/or unresolved please follow-up with PCP or [facility] orthopedic provider for further evaluation, contact information has been provided below.

## 2022-10-20 DIAGNOSIS — M5416 Radiculopathy, lumbar region: Secondary | ICD-10-CM | POA: Diagnosis not present

## 2022-10-22 DIAGNOSIS — M5417 Radiculopathy, lumbosacral region: Secondary | ICD-10-CM | POA: Diagnosis not present

## 2022-10-22 DIAGNOSIS — M5116 Intervertebral disc disorders with radiculopathy, lumbar region: Secondary | ICD-10-CM | POA: Diagnosis not present

## 2022-10-28 DIAGNOSIS — M5416 Radiculopathy, lumbar region: Secondary | ICD-10-CM | POA: Diagnosis not present

## 2022-10-28 DIAGNOSIS — M5126 Other intervertebral disc displacement, lumbar region: Secondary | ICD-10-CM | POA: Diagnosis not present

## 2022-11-06 DIAGNOSIS — Z6827 Body mass index (BMI) 27.0-27.9, adult: Secondary | ICD-10-CM | POA: Diagnosis not present

## 2022-11-06 DIAGNOSIS — M5126 Other intervertebral disc displacement, lumbar region: Secondary | ICD-10-CM | POA: Diagnosis not present

## 2022-11-19 DIAGNOSIS — M5451 Vertebrogenic low back pain: Secondary | ICD-10-CM | POA: Diagnosis not present

## 2022-11-23 DIAGNOSIS — M5451 Vertebrogenic low back pain: Secondary | ICD-10-CM | POA: Diagnosis not present

## 2022-11-26 DIAGNOSIS — M5451 Vertebrogenic low back pain: Secondary | ICD-10-CM | POA: Diagnosis not present

## 2022-11-30 DIAGNOSIS — M5451 Vertebrogenic low back pain: Secondary | ICD-10-CM | POA: Diagnosis not present

## 2022-12-03 DIAGNOSIS — M5451 Vertebrogenic low back pain: Secondary | ICD-10-CM | POA: Diagnosis not present

## 2022-12-07 DIAGNOSIS — M5451 Vertebrogenic low back pain: Secondary | ICD-10-CM | POA: Diagnosis not present

## 2022-12-10 DIAGNOSIS — M5451 Vertebrogenic low back pain: Secondary | ICD-10-CM | POA: Diagnosis not present

## 2022-12-14 DIAGNOSIS — M5451 Vertebrogenic low back pain: Secondary | ICD-10-CM | POA: Diagnosis not present

## 2022-12-16 DIAGNOSIS — Z6826 Body mass index (BMI) 26.0-26.9, adult: Secondary | ICD-10-CM | POA: Diagnosis not present

## 2022-12-16 DIAGNOSIS — M5416 Radiculopathy, lumbar region: Secondary | ICD-10-CM | POA: Diagnosis not present

## 2022-12-16 DIAGNOSIS — M5126 Other intervertebral disc displacement, lumbar region: Secondary | ICD-10-CM | POA: Diagnosis not present

## 2022-12-17 DIAGNOSIS — M5451 Vertebrogenic low back pain: Secondary | ICD-10-CM | POA: Diagnosis not present

## 2023-01-04 DIAGNOSIS — M5126 Other intervertebral disc displacement, lumbar region: Secondary | ICD-10-CM | POA: Diagnosis not present

## 2023-01-04 DIAGNOSIS — Z9889 Other specified postprocedural states: Secondary | ICD-10-CM | POA: Diagnosis not present

## 2023-01-04 DIAGNOSIS — M5117 Intervertebral disc disorders with radiculopathy, lumbosacral region: Secondary | ICD-10-CM | POA: Diagnosis not present

## 2023-01-05 HISTORY — PX: BACK SURGERY: SHX140

## 2023-01-06 ENCOUNTER — Other Ambulatory Visit: Payer: Self-pay | Admitting: Family Medicine

## 2023-02-01 ENCOUNTER — Telehealth: Payer: Self-pay | Admitting: Family Medicine

## 2023-02-01 NOTE — Telephone Encounter (Signed)
Contacted Steven Pollard to schedule their annual wellness visit. Appointment made for 02/17/23.  Steven Pollard AWV direct phone # [phone]   Due to schedule change r/s 4/30 appt to 5/8  Pt aware of appt date/time change

## 2023-02-09 ENCOUNTER — Ambulatory Visit: Payer: Medicare HMO

## 2023-02-16 DIAGNOSIS — L308 Other specified dermatitis: Secondary | ICD-10-CM | POA: Diagnosis not present

## 2023-02-17 ENCOUNTER — Ambulatory Visit (INDEPENDENT_AMBULATORY_CARE_PROVIDER_SITE_OTHER): Payer: Medicare HMO

## 2023-02-17 VITALS — BP 118/64 | HR 68 | Temp 98.6°F | Ht 71.0 in | Wt 193.5 lb

## 2023-02-17 DIAGNOSIS — Z0000 Encounter for general adult medical examination without abnormal findings: Secondary | ICD-10-CM | POA: Diagnosis not present

## 2023-02-17 NOTE — Progress Notes (Signed)
Subjective:   Steven Pollard is a 67 y.o. male who presents for Medicare Annual/Subsequent preventive examination.  Review of Systems     Cardiac Risk Factors include: advanced age (>71men, >36 women);dyslipidemia;male gender     Objective:    Today's Vitals   02/17/23 0923  BP: 118/64  Pulse: 68  Temp: 98.6 F (37 C)  TempSrc: Oral  SpO2: 98%  Weight: 193 lb 8 oz (87.8 kg)  Height: 5\' 11"  (1.803 m)   Body mass index is 26.99 kg/m.     02/17/2023    9:45 AM 02/03/2022    8:40 AM 01/28/2021    8:16 AM  Advanced Directives  Does Patient Have a Medical Advance Directive? Yes Yes Yes  Type of Estate agent of [place];Living will Healthcare Power of [place];Living will Healthcare Power of Attorney  Copy of Healthcare Power of Attorney in Chart? No - copy requested No - copy requested     Current Medications (verified) Outpatient Encounter Medications as of 02/17/2023  Medication Sig   aspirin 81 MG tablet Take 1 tablet by mouth daily.   augmented betamethasone dipropionate (DIPROLENE-AF) 0.05 % ointment Apply topically 2 (two) times daily.   celecoxib (CELEBREX) 200 MG capsule TAKE 1 CAPSULE BY MOUTH EVERY DAY   cetirizine (ZYRTEC) 10 MG tablet Take 10 mg by mouth as needed.    ciprofloxacin-dexamethasone (CIPRODEX) OTIC suspension Place 4 drops into the right ear 2 (two) times daily.   EPINEPHrine (EPIPEN 2-PAK) 0.3 mg/0.3 mL IJ SOAJ injection Inject 0.3 mLs (0.3 mg total) into the muscle once as needed (difficulty breathing, hypotension, oral swelling).   famotidine (PEPCID) 20 MG tablet Take 20 mg by mouth as needed for heartburn or indigestion.   hydrocortisone 2.5 % cream    omega-3 acid ethyl esters (LOVAZA) 1 g capsule Take 4 capsules by mouth once daily   oxyCODONE-acetaminophen (PERCOCET/ROXICET) 5-325 MG tablet Take 1 tablet by mouth every 6 (six) hours as needed for severe pain.   predniSONE (STERAPRED UNI-PAK 21 TAB) 10 MG (21) TBPK tablet  Take by mouth daily. Take 6 tabs by mouth daily  for 2 days, then 5 tabs for 2 days, then 4 tabs for 2 days, then 3 tabs for 2 days, 2 tabs for 2 days, then 1 tab by mouth daily for 2 days   rosuvastatin (CRESTOR) 10 MG tablet TAKE 1 TABLET EVERY DAY   No facility-administered encounter medications on file as of 02/17/2023.    Allergies (verified) Latex, Oxycodone-acetaminophen, Tyloxapol, and Caffeine   History: Past Medical History:  Diagnosis Date   Colon polyps    GERD (gastroesophageal reflux disease)    History of seizure disorder    Hyperlipemia    Prostate disease    Wears glasses    Past Surgical History:  Procedure Laterality Date   BACK SURGERY  2018   BACK SURGERY  01/05/2023   HERNIA REPAIR  1984   left   perforated eardrum  1995   SCAR REVISION  1979   TONSILLECTOMY  1962   Family History  Problem Relation Age of Onset   Arthritis Mother    Heart disease Father 37       heart attack   Social History   Socioeconomic History   Marital status: Married    Spouse name: Not on file   Number of children: Not on file   Years of education: Not on file   Highest education level: Associate degree: occupational, Scientist, product/process development, or  vocational program  Occupational History   Not on file  Tobacco Use   Smoking status: Former    Packs/day: 1.00    Years: 34.00    Additional pack years: 0.00    Total pack years: 34.00    Types: Cigarettes    Quit date: 2008    Years since quitting: 16.3   Smokeless tobacco: Never   Tobacco comments:    Encouraged to remain smoke free  Vaping Use   Vaping Use: Never used  Substance and Sexual Activity   Alcohol use: Yes    Comment: socially   Drug use: No   Sexual activity: Not on file  Other Topics Concern   Not on file  Social History Narrative   Not on file   Social Determinants of Health   Financial Resource Strain: Low Risk  (02/17/2023)   Overall Financial Resource Strain (CARDIA)    Difficulty of Paying Living  Expenses: Not hard at all  Food Insecurity: No Food Insecurity (02/17/2023)   Hunger Vital Sign    Worried About Running Out of Food in the Last Year: Never true    Ran Out of Food in the Last Year: Never true  Transportation Needs: No Transportation Needs (02/17/2023)   PRAPARE - Administrator, Civil Service (Medical): No    Lack of Transportation (Non-Medical): No  Physical Activity: Inactive (02/17/2023)   Exercise Vital Sign    Days of Exercise per Week: 0 days    Minutes of Exercise per Session: 0 min  Stress: No Stress Concern Present (02/17/2023)   [employer] of Occupational Health - Occupational Stress Questionnaire    Feeling of Stress : Not at all  Social Connections: Socially Integrated (02/17/2023)   Social Connection and Isolation Panel [NHANES]    Frequency of Communication with Friends and Family: More than three times a week    Frequency of Social Gatherings with Friends and Family: More than three times a week    Attends Religious Services: More than 4 times per year    Active Member of [employer] or Organizations: Yes    Attends Engineer, structural: More than 4 times per year    Marital Status: Married    Tobacco Counseling Counseling given: Not Answered Tobacco comments: Encouraged to remain smoke free   Clinical Intake:  Pre-visit preparation completed: Yes  Pain : No/denies pain     BMI - recorded: 26.99 Nutritional Risks: None Diabetes: No  How often do you need to have someone help you when you read instructions, pamphlets, or other written materials from your doctor or pharmacy?: 1 - Never  Diabetic?  No  Interpreter Needed?: No  Information entered by :: Theresa Mulligan LPN   Activities of Daily Living    02/17/2023    9:42 AM  In your present state of health, do you have any difficulty performing the following activities:  Hearing? 0  Vision? 0  Difficulty concentrating or making decisions? 0  Walking or climbing stairs?  0  Dressing or bathing? 0  Doing errands, shopping? 0  Preparing Food and eating ? N  Using the Toilet? N  In the past six months, have you accidently leaked urine? N  Do you have problems with loss of bowel control? N  Managing your Medications? N  Managing your Finances? N  Housekeeping or managing your Housekeeping? N    Patient Care Team: Kristian Covey, MD as PCP - General (Family Medicine)  Indicate any  recent Medical Services you may have received from other than [facility] providers in the past year (date may be approximate).     Assessment:   This is a routine wellness examination for Rohan.  Hearing/Vision screen Hearing Screening - Comments:: Denies hearing difficulties   Vision Screening - Comments:: Wears rx glasses - up to date with routine eye exams with  Dr Emily Filbert  Dietary issues and exercise activities discussed: Current Exercise Habits: The patient does not participate in regular exercise at present, Exercise limited by: orthopedic condition(s)   Goals Addressed               This Visit's Progress     Stay Healthy (pt-stated)         Depression Screen    02/17/2023    9:28 AM 08/17/2022    9:16 AM 02/03/2022    8:42 AM 10/31/2021    8:30 AM 01/28/2021    8:14 AM 10/25/2019    7:45 AM 10/25/2018    8:38 AM  PHQ 2/9 Scores  PHQ - 2 Score 0 0 0 0 1 0 0  PHQ- 9 Score      0     Fall Risk    02/17/2023    9:43 AM 08/14/2022    2:25 PM 02/03/2022    8:41 AM 01/28/2021    8:17 AM 10/25/2019    7:58 AM  Fall Risk   Falls in the past year? 0 0 0 0 0  Number falls in past yr: 0  0 0 0  Injury with Fall? 0  0 0   Risk for fall due to : No Fall Risks  Medication side effect Impaired vision No Fall Risks  Follow up Falls prevention discussed  Falls evaluation completed;Education provided;Falls prevention discussed Falls prevention discussed     FALL RISK PREVENTION PERTAINING TO THE HOME:  Any stairs in or around the home? Yes  If so, are there any  without handrails? No  Home free of loose throw rugs in walkways, pet beds, electrical cords, etc? Yes  Adequate lighting in your home to reduce risk of falls? Yes   ASSISTIVE DEVICES UTILIZED TO PREVENT FALLS:  Life alert? No  Use of a cane, walker or w/c? No  Grab bars in the bathroom? Yes  Shower chair or bench in shower? No  Elevated toilet seat or a handicapped toilet? Yes   TIMED UP AND GO:  Was the test performed? Yes .  Length of time to ambulate 10 feet: 10 sec.   Gait steady and fast without use of assistive device  Cognitive Function:        02/17/2023    9:45 AM 02/03/2022    8:44 AM 01/28/2021    8:20 AM  6CIT Screen  What Year? 0 points 0 points 0 points  What month? 0 points 0 points 0 points  What time? 0 points 0 points   Count back from 20 0 points 0 points 0 points  Months in reverse 0 points 0 points 2 points  Repeat phrase 0 points 0 points 0 points  Total Score 0 points 0 points     Immunizations Immunization History  Administered Date(s) Administered   Fluad Quad(high Dose 65+) 08/29/2019, 08/04/2021, 08/17/2022   Influenza Split 10/12/2006, 07/12/2010   Influenza,inj,Quad PF,6+ Mos 09/26/2014, 10/02/2015, 07/10/2016, 10/22/2017, 10/25/2018   PFIZER(Purple Top)SARS-COV-2 Vaccination 09/27/2020   Pfizer Covid-19 Vaccine Bivalent Booster 58yrs & up 08/12/2021   Pneumococcal Conjugate-13 10/25/2019   Pneumococcal  Polysaccharide-23 10/31/2021   Tdap 07/30/2010   Zoster, Live 10/02/2015    TDAP status: Due, Education has been provided regarding the importance of this vaccine. Advised may receive this vaccine at local pharmacy or Health Dept. Aware to provide a copy of the vaccination record if obtained from local pharmacy or Health Dept. Verbalized acceptance and understanding.  Flu Vaccine status: Up to date  Pneumococcal vaccine status: Up to date  Covid-19 vaccine status: Completed vaccines  Qualifies for Shingles Vaccine? Yes   Zostavax  completed No   Shingrix Completed?: No.    Education has been provided regarding the importance of this vaccine. Patient has been advised to call insurance company to determine out of pocket expense if they have not yet received this vaccine. Advised may also receive vaccine at local pharmacy or Health Dept. Verbalized acceptance and understanding.  Screening Tests Health Maintenance  Topic Date Due   DTaP/Tdap/Td (2 - Td or Tdap) 07/30/2020   COVID-19 Vaccine (5 - 2023-24 season) 03/05/2023 (Originally 06/12/2022)   Zoster Vaccines- Shingrix (1 of 2) 05/20/2023 (Originally 12/13/2003)   INFLUENZA VACCINE  05/13/2023   Medicare Annual Wellness (AWV)  02/17/2024   COLONOSCOPY (Pts 45-15yrs Insurance coverage will need to be confirmed)  04/16/2032   Pneumonia Vaccine 25+ Years old  Completed   Hepatitis C Screening  Completed   HPV VACCINES  Aged Out    Health Maintenance  Health Maintenance Due  Topic Date Due   DTaP/Tdap/Td (2 - Td or Tdap) 07/30/2020    Colorectal cancer screening: Type of screening: Colonoscopy. Completed 04/16/22. Repeat every 10 years  Lung Cancer Screening: (Low Dose CT Chest recommended if Age 74-80 years, 30 pack-year currently smoking OR have quit w/in 15years.) does not qualify.     Additional Screening:  Hepatitis C Screening: does qualify; Completed 10/22/17  Vision Screening: Recommended annual ophthalmology exams for early detection of glaucoma and other disorders of the eye. Is the patient up to date with their annual eye exam?  Yes  Who is the provider or what is the name of the office in which the patient attends annual eye exams? Dr Emily Filbert If pt is not established with a provider, would they like to be referred to a provider to establish care? No .   Dental Screening: Recommended annual dental exams for proper oral hygiene  Community Resource Referral / Chronic Care Management:  CRR required this visit?  No   CCM required this visit?  No       Plan:     I have personally reviewed and noted the following in the patient's chart:   Medical and social history Use of alcohol, tobacco or illicit drugs  Current medications and supplements including opioid prescriptions. Patient is currently taking opioid prescriptions. Information provided to patient regarding non-opioid alternatives. Patient advised to discuss non-opioid treatment plan with their provider. Functional ability and status Nutritional status Physical activity Advanced directives List of other physicians Hospitalizations, surgeries, and ER visits in previous 12 months Vitals Screenings to include cognitive, depression, and falls Referrals and appointments  In addition, I have reviewed and discussed with patient certain preventive protocols, quality metrics, and best practice recommendations. A written personalized care plan for preventive services as well as general preventive health recommendations were provided to patient.     Tillie Rung, LPN   06/19/1190   Nurse Notes: None

## 2023-02-17 NOTE — Patient Instructions (Addendum)
Steven Pollard , Thank you for taking time to come for your Medicare Wellness Visit. I appreciate your ongoing commitment to your health goals. Please review the following plan we discussed and let me know if I can assist you in the future.   These are the goals we discussed:  Goals       Patient Stated      Lose weight       Patient Stated      02/03/2022, no goals      Stay Healthy (pt-stated)        This is a list of the screening recommended for you and due dates:  Health Maintenance  Topic Date Due   DTaP/Tdap/Td vaccine (2 - Td or Tdap) 07/30/2020   COVID-19 Vaccine (5 - 2023-24 season) 03/05/2023*   Zoster (Shingles) Vaccine (1 of 2) 05/20/2023*   Flu Shot  05/13/2023   Medicare Annual Wellness Visit  02/17/2024   Colon Cancer Screening  04/16/2032   Pneumonia Vaccine  Completed   Hepatitis C Screening: USPSTF Recommendation to screen - Ages 18-79 yo.  Completed   HPV Vaccine  Aged Out  *Topic was postponed. The date shown is not the original due date.   Opioid Pain Medicine Management Opioids are powerful medicines that are used to treat moderate to severe pain. When used for short periods of time, they can help you to: Sleep better. Do better in physical or occupational therapy. Feel better in the first few days after an injury. Recover from surgery. Opioids should be taken with the supervision of a trained health care provider. They should be taken for the shortest period of time possible. This is because opioids can be addictive, and the longer you take opioids, the greater your risk of addiction. This addiction can also be called opioid use disorder. What are the risks? Using opioid pain medicines for longer than 3 days increases your risk of side effects. Side effects include: Constipation. Nausea and vomiting. Breathing difficulties (respiratory depression). Drowsiness. Confusion. Opioid use disorder. Itching. Taking opioid pain medicine for a long period of time  can affect your ability to do daily tasks. It also puts you at risk for: Motor vehicle crashes. Depression. Suicide. Heart attack. Overdose, which can be life-threatening. What is a pain treatment plan? A pain treatment plan is an agreement between you and your health care provider. Pain is unique to each person, and treatments vary depending on your condition. To manage your pain, you and your health care provider need to work together. To help you do this: Discuss the goals of your treatment, including how much pain you might expect to have and how you will manage the pain. Review the risks and benefits of taking opioid medicines. Remember that a good treatment plan uses more than one approach and minimizes the chance of side effects. Be honest about the amount of medicines you take and about any drug or alcohol use. Get pain medicine prescriptions from only one health care provider. Pain can be managed with many types of alternative treatments. Ask your health care provider to refer you to one or more specialists who can help you manage pain through: Physical or occupational therapy. Counseling (cognitive behavioral therapy). Good nutrition. Biofeedback. Massage. Meditation. Non-opioid medicine. Following a gentle exercise program. How to use opioid pain medicine Taking medicine Take your pain medicine exactly as told by your health care provider. Take it only when you need it. If your pain gets less severe, you may  take less than your prescribed dose if your health care provider approves. If you are not having pain, do nottake pain medicine unless your health care provider tells you to take it. If your pain is severe, do nottry to treat it yourself by taking more pills than instructed on your prescription. Contact your health care provider for help. Write down the times when you take your pain medicine. It is easy to become confused while on pain medicine. Writing the time can help  you avoid overdose. Take other over-the-counter or prescription medicines only as told by your health care provider. Keeping yourself and others safe  While you are taking opioid pain medicine: Do not drive, use machinery, or power tools. Do not sign legal documents. Do not drink alcohol. Do not take sleeping pills. Do not supervise children by yourself. Do not do activities that require climbing or being in high places. Do not go to a lake, river, ocean, spa, or swimming pool. Do not share your pain medicine with anyone. Keep pain medicine in a locked cabinet or in a secure area where pets and children cannot reach it. Stopping your use of opioids If you have been taking opioid medicine for more than a few weeks, you may need to slowly decrease (taper) how much you take until you stop completely. Tapering your use of opioids can decrease your risk of symptoms of withdrawal, such as: Pain and cramping in the abdomen. Nausea. Sweating. Sleepiness. Restlessness. Uncontrollable shaking (tremors). Cravings for the medicine. Do not attempt to taper your use of opioids on your own. Talk with your health care provider about how to do this. Your health care provider may prescribe a step-down schedule based on how much medicine you are taking and how long you have been taking it. Getting rid of leftover pills Do not save any leftover pills. Get rid of leftover pills safely by: Taking the medicine to a prescription take-back program. This is usually offered by the county or law enforcement. Bringing them to a pharmacy that has a drug disposal container. Flushing them down the toilet. Check the label or package insert of your medicine to see whether this is safe to do. Throwing them out in the trash. Check the label or package insert of your medicine to see whether this is safe to do. If it is safe to throw it out, remove the medicine from the original container, put it into a sealable bag or  container, and mix it with used coffee grounds, food scraps, dirt, or cat litter before putting it in the trash. Follow these instructions at home: Activity Do exercises as told by your health care provider. Avoid activities that make your pain worse. Return to your normal activities as told by your health care provider. Ask your health care provider what activities are safe for you. General instructions You may need to take these actions to prevent or treat constipation: Drink enough fluid to keep your urine pale yellow. Take over-the-counter or prescription medicines. Eat foods that are high in fiber, such as beans, whole grains, and fresh fruits and vegetables. Limit foods that are high in fat and processed sugars, such as fried or sweet foods. Keep all follow-up visits. This is important. Where to find support If you have been taking opioids for a long time, you may benefit from receiving support for quitting from a local support group or counselor. Ask your health care provider for a referral to these resources in your area. Where to  find more information Centers for Disease Control and Prevention (CDC): FootballExhibition.com.br [place] Food and Drug Administration (FDA): PumpkinSearch.com.ee Get help right away if: You may have taken too much of an opioid (overdosed). Common symptoms of an overdose: Your breathing is slower or more shallow than normal. You have a very slow heartbeat (pulse). You have slurred speech. You have nausea and vomiting. Your pupils become very small. You have other potential symptoms: You are very confused. You faint or feel like you will faint. You have cold, clammy skin. You have blue lips or fingernails. You have thoughts of harming yourself or harming others. These symptoms may represent a serious problem that is an emergency. Do not wait to see if the symptoms will go away. Get medical help right away. Call your local emergency services (911 in the [place] Do not drive  yourself to the hospital.  If you ever feel like you may hurt yourself or others, or have thoughts about taking your own life, get help right away. Go to your nearest emergency department or: Call your local emergency services (911 in the [place] Call the [facility] ((956)217-2077 in the [place] Call a suicide crisis helpline, such as the National Suicide Prevention Lifeline at (249) 253-4141 or 988 in the [place] This is open 24 hours a day in the [place] Text the Crisis Text Line at (905)526-0167 (in the [place] Summary Opioid medicines can help you manage moderate to severe pain for a short period of time. A pain treatment plan is an agreement between you and your health care provider. Discuss the goals of your treatment, including how much pain you might expect to have and how you will manage the pain. If you think that you or someone else may have taken too much of an opioid, get medical help right away. This information is not intended to replace advice given to you by your health care provider. Make sure you discuss any questions you have with your health care provider. Document Revised: 04/23/2021 Document Reviewed: 01/08/2021 Elsevier Patient Education  2023 [employer].  Advanced directives: Please bring a copy of your health care power of attorney and living will to the office to be added to your chart at your convenience.   Conditions/risks identified: None  Next appointment: Follow up in one year for your annual wellness visit    Preventive Care 65 Years and Older, Male Preventive care refers to lifestyle choices and visits with your health care provider that can promote health and wellness. What does preventive care include? A yearly physical exam. This is also called an annual well check. Dental exams once or twice a year. Routine eye exams. Ask your health care provider how often you should have your eyes checked. Personal lifestyle choices, including: Daily care  of your teeth and gums. Regular physical activity. Eating a healthy diet. Avoiding tobacco and drug use. Limiting alcohol use. Practicing safe sex. Taking low-dose aspirin every day. Taking vitamin and mineral supplements as recommended by your health care provider. What happens during an annual well check? The services and screenings done by your health care provider during your annual well check will depend on your age, overall health, lifestyle risk factors, and family history of disease. Counseling  Your health care provider may ask you questions about your: Alcohol use. Tobacco use. Drug use. Emotional well-being. Home and relationship well-being. Sexual activity. Eating habits. History of falls. Memory and ability to understand (cognition). Work and work Astronomer. Reproductive health. Screening  You  may have the following tests or measurements: Height, weight, and BMI. Blood pressure. Lipid and cholesterol levels. These may be checked every 5 years, or more frequently if you are over 5 years old. Skin check. Lung cancer screening. You may have this screening every year starting at age 18 if you have a 30-pack-year history of smoking and currently smoke or have quit within the past 15 years. Fecal occult blood test (FOBT) of the stool. You may have this test every year starting at age 65. Flexible sigmoidoscopy or colonoscopy. You may have a sigmoidoscopy every 5 years or a colonoscopy every 10 years starting at age 68. Hepatitis C blood test. Hepatitis B blood test. Sexually transmitted disease (STD) testing. Diabetes screening. This is done by checking your blood sugar (glucose) after you have not eaten for a while (fasting). You may have this done every 1-3 years. Bone density scan. This is done to screen for osteoporosis. You may have this done starting at age 19. Mammogram. This may be done every 1-2 years. Talk to your health care provider about how often you  should have regular mammograms. Talk with your health care provider about your test results, treatment options, and if necessary, the need for more tests. Vaccines  Your health care provider may recommend certain vaccines, such as: Influenza vaccine. This is recommended every year. Tetanus, diphtheria, and acellular pertussis (Tdap, Td) vaccine. You may need a Td booster every 10 years. Zoster vaccine. You may need this after age 48. Pneumococcal 13-valent conjugate (PCV13) vaccine. One dose is recommended after age 11. Pneumococcal polysaccharide (PPSV23) vaccine. One dose is recommended after age 67. Talk to your health care provider about which screenings and vaccines you need and how often you need them. This information is not intended to replace advice given to you by your health care provider. Make sure you discuss any questions you have with your health care provider. Document Released: 10/25/2015 Document Revised: 06/17/2016 Document Reviewed: 07/30/2015 Elsevier Interactive Patient Education  2017 [place] Prevention in the Home Falls can cause injuries. They can happen to people of all ages. There are many things you can do to make your home safe and to help prevent falls. What can I do on the outside of my home? Regularly fix the edges of walkways and driveways and fix any cracks. Remove anything that might make you trip as you walk through a door, such as a raised step or threshold. Trim any bushes or trees on the path to your home. Use bright outdoor lighting. Clear any walking paths of anything that might make someone trip, such as rocks or tools. Regularly check to see if handrails are loose or broken. Make sure that both sides of any steps have handrails. Any raised decks and porches should have guardrails on the edges. Have any leaves, snow, or ice cleared regularly. Use sand or salt on walking paths during winter. Clean up any spills in your garage right away.  This includes oil or grease spills. What can I do in the bathroom? Use night lights. Install grab bars by the toilet and in the tub and shower. Do not use towel bars as grab bars. Use non-skid mats or decals in the tub or shower. If you need to sit down in the shower, use a plastic, non-slip stool. Keep the floor dry. Clean up any water that spills on the floor as soon as it happens. Remove soap buildup in the tub or shower regularly. Attach  bath mats securely with double-sided non-slip rug tape. Do not have throw rugs and other things on the floor that can make you trip. What can I do in the bedroom? Use night lights. Make sure that you have a light by your bed that is easy to reach. Do not use any sheets or blankets that are too big for your bed. They should not hang down onto the floor. Have a firm chair that has side arms. You can use this for support while you get dressed. Do not have throw rugs and other things on the floor that can make you trip. What can I do in the kitchen? Clean up any spills right away. Avoid walking on wet floors. Keep items that you use a lot in easy-to-reach places. If you need to reach something above you, use a strong step stool that has a grab bar. Keep electrical cords out of the way. Do not use floor polish or wax that makes floors slippery. If you must use wax, use non-skid floor wax. Do not have throw rugs and other things on the floor that can make you trip. What can I do with my stairs? Do not leave any items on the stairs. Make sure that there are handrails on both sides of the stairs and use them. Fix handrails that are broken or loose. Make sure that handrails are as long as the stairways. Check any carpeting to make sure that it is firmly attached to the stairs. Fix any carpet that is loose or worn. Avoid having throw rugs at the top or bottom of the stairs. If you do have throw rugs, attach them to the floor with carpet tape. Make sure that  you have a light switch at the top of the stairs and the bottom of the stairs. If you do not have them, ask someone to add them for you. What else can I do to help prevent falls? Wear shoes that: Do not have high heels. Have rubber bottoms. Are comfortable and fit you well. Are closed at the toe. Do not wear sandals. If you use a stepladder: Make sure that it is fully opened. Do not climb a closed stepladder. Make sure that both sides of the stepladder are locked into place. Ask someone to hold it for you, if possible. Clearly mark and make sure that you can see: Any grab bars or handrails. First and last steps. Where the edge of each step is. Use tools that help you move around (mobility aids) if they are needed. These include: Canes. Walkers. Scooters. Crutches. Turn on the lights when you go into a dark area. Replace any light bulbs as soon as they burn out. Set up your furniture so you have a clear path. Avoid moving your furniture around. If any of your floors are uneven, fix them. If there are any pets around you, be aware of where they are. Review your medicines with your doctor. Some medicines can make you feel dizzy. This can increase your chance of falling. Ask your doctor what other things that you can do to help prevent falls. This information is not intended to replace advice given to you by your health care provider. Make sure you discuss any questions you have with your health care provider. Document Released: 07/25/2009 Document Revised: 03/05/2016 Document Reviewed: 11/02/2014 Elsevier Interactive Patient Education  2017 [employer].

## 2023-02-24 ENCOUNTER — Encounter: Payer: Self-pay | Admitting: Family Medicine

## 2023-02-24 ENCOUNTER — Ambulatory Visit (INDEPENDENT_AMBULATORY_CARE_PROVIDER_SITE_OTHER): Payer: Medicare HMO | Admitting: Family Medicine

## 2023-02-24 VITALS — BP 120/80 | HR 71 | Temp 97.8°F | Wt 193.0 lb

## 2023-02-24 DIAGNOSIS — M79661 Pain in right lower leg: Secondary | ICD-10-CM | POA: Diagnosis not present

## 2023-02-24 NOTE — Progress Notes (Signed)
   Subjective:    Patient ID: Steven Pollard, male    DOB: 1956-05-30, 67 y.o.   MRN: [id number]  HPI Here for the sudden onset of pain and swelling in the right calf. No recent trauma, but he notes that he had a lumbar spine surgery on 01-04-23 so naturally he was immobilized for awhile. He started beoing more active last week with mowing his lawn with a riding mower and spreading mulch. No chest pain or SOB. He says it feels much better today and the swelling has gone down a bit.    Review of Systems  Constitutional: Negative.   Respiratory: Negative.    Cardiovascular:  Positive for leg swelling.  Musculoskeletal:  Positive for myalgias.       Objective:   Physical Exam Constitutional:      General: He is not in acute distress.    Appearance: Normal appearance.  Cardiovascular:     Rate and Rhythm: Normal rate and regular rhythm.     Pulses: Normal pulses.     Heart sounds: Normal heart sounds.  Pulmonary:     Effort: Pulmonary effort is normal.     Breath sounds: Normal breath sounds.  Musculoskeletal:     Comments: The right calf is slightly swollen, but there is no tenderness. No cords are felt. Denna Haggard is negative.   Neurological:     Mental Status: He is alert.           Assessment & Plan:  Right calf pain. This could be a simple muscle strain but we need to rule out a DVT. We ordered a stat venous doppler of the leg.  Gershon Crane, MD

## 2023-02-25 ENCOUNTER — Ambulatory Visit (HOSPITAL_COMMUNITY)
Admission: RE | Admit: 2023-02-25 | Discharge: 2023-02-25 | Disposition: A | Discharge status: Home / Self Care | Payer: Medicare HMO | Source: Ambulatory Visit | Attending: Internal Medicine | Admitting: Internal Medicine

## 2023-02-25 DIAGNOSIS — M79661 Pain in right lower leg: Secondary | ICD-10-CM | POA: Insufficient documentation

## 2023-03-21 ENCOUNTER — Other Ambulatory Visit: Payer: Self-pay | Admitting: Family Medicine

## 2023-03-30 ENCOUNTER — Encounter: Payer: Self-pay | Admitting: Family Medicine

## 2023-03-30 ENCOUNTER — Ambulatory Visit (INDEPENDENT_AMBULATORY_CARE_PROVIDER_SITE_OTHER): Payer: Medicare HMO | Admitting: Family Medicine

## 2023-03-30 VITALS — BP 120/80 | HR 75 | Temp 97.9°F | Ht 71.26 in | Wt 194.4 lb

## 2023-03-30 DIAGNOSIS — Z125 Encounter for screening for malignant neoplasm of prostate: Secondary | ICD-10-CM | POA: Diagnosis not present

## 2023-03-30 DIAGNOSIS — Z0000 Encounter for general adult medical examination without abnormal findings: Secondary | ICD-10-CM

## 2023-03-30 LAB — COMPREHENSIVE METABOLIC PANEL
ALT: 26 U/L (ref 0–53)
AST: 21 U/L (ref 0–37)
Albumin: 4.8 g/dL (ref 3.5–5.2)
Alkaline Phosphatase: 57 U/L (ref 39–117)
BUN: 8 mg/dL (ref 6–23)
CO2: 29 mEq/L (ref 19–32)
Calcium: 10.1 mg/dL (ref 8.4–10.5)
Chloride: 102 mEq/L (ref 96–112)
Creatinine, Ser: 0.96 mg/dL (ref 0.40–1.50)
GFR: 80.77 mL/min (ref >60.00)
Glucose, Bld: 88 mg/dL (ref 70–99)
Potassium: 4.3 mEq/L (ref 3.5–5.1)
Sodium: 140 mEq/L (ref 135–145)
Total Bilirubin: 1 mg/dL (ref 0.2–1.2)
Total Protein: 7.3 g/dL (ref 6.0–8.3)

## 2023-03-30 LAB — CBC WITH DIFFERENTIAL/PLATELET
Basophils Absolute: 0 10*3/uL (ref 0.0–0.1)
Basophils Relative: 0.6 % (ref 0.0–3.0)
Eosinophils Absolute: 0 10*3/uL (ref 0.0–0.7)
Eosinophils Relative: 0 % (ref 0.0–5.0)
HCT: 47.4 % (ref 39.0–52.0)
Hemoglobin: 15.9 g/dL (ref 13.0–17.0)
Lymphocytes Relative: 30.6 % (ref 12.0–46.0)
Lymphs Abs: 2.2 10*3/uL (ref 0.7–4.0)
MCHC: 33.5 g/dL (ref 30.0–36.0)
MCV: 97 fl (ref 78.0–100.0)
Monocytes Absolute: 0.7 10*3/uL (ref 0.1–1.0)
Monocytes Relative: 10 % (ref 3.0–12.0)
Neutro Abs: 4.2 10*3/uL (ref 1.4–7.7)
Neutrophils Relative %: 58.8 % (ref 43.0–77.0)
Platelets: 189 10*3/uL (ref 150.0–400.0)
RBC: 4.88 Mil/uL (ref 4.22–5.81)
RDW: 12.7 % (ref 11.5–15.5)
WBC: 7.2 10*3/uL (ref 4.0–10.5)

## 2023-03-30 LAB — LIPID PANEL
Cholesterol: 126 mg/dL (ref 0–200)
HDL: 36.6 mg/dL — ABNORMAL LOW (ref >39.00)
LDL Cholesterol: 68 mg/dL (ref 0–99)
NonHDL: 89.7
Total CHOL/HDL Ratio: 3
Triglycerides: 109 mg/dL (ref 0.0–149.0)
VLDL: 21.8 mg/dL (ref 0.0–40.0)

## 2023-03-30 LAB — PSA, MEDICARE: PSA: 2.22 ng/ml (ref 0.10–4.00)

## 2023-03-30 MED ORDER — OMEGA-3-ACID ETHYL ESTERS 1 G PO CAPS: ORAL_CAPSULE | ORAL | 3 refills | Status: DC

## 2023-03-30 NOTE — Progress Notes (Signed)
Established Patient Office Visit  Subjective   Patient ID: Steven Pollard, male    DOB: [birth year]  Age: 67 y.o. MRN: [id number]  Chief Complaint  Patient presents with   Annual Exam    HPI   Steven Pollard is here for physical exam.  Steven Pollard had back surgery March 25 and is doing reasonably well since then with less back pain (virtual resolution of his back pain) but has had some numbness right foot since the surgery.  Overall though improved.  Steven Pollard has history of GERD, dyslipidemia, history of kidney stones, intermittent insomnia.  Mention of aortic atherosclerosis from previous scans.  Steven Pollard does remain on rosuvastatin and Lovaza and needs refills and follow-up labs.  Had recent venous Doppler right lower extremity for some right calf fullness.  No DVT.  Steven Pollard has chronic right suppurative otitis changes.  No recent ear pain.  No drainage.  No fever.  Health maintenance reviewed:  Health Maintenance  Topic Date Due   DTaP/Tdap/Td (2 - Td or Tdap) 07/30/2020   COVID-19 Vaccine (5 - 2023-24 season) 06/12/2022   Zoster Vaccines- Shingrix (1 of 2) 05/20/2023 (Originally 12/13/2003)   INFLUENZA VACCINE  05/13/2023   Medicare Annual Wellness (AWV)  02/17/2024   Colonoscopy  04/16/2032   Pneumonia Vaccine 26+ Years old  Completed   Hepatitis C Screening  Completed   HPV VACCINES  Aged Out    Review of Systems  Constitutional:  Negative for chills, fever, malaise/fatigue and weight loss.  HENT:  Negative for hearing loss.   Eyes:  Negative for blurred vision and double vision.  Respiratory:  Negative for cough and shortness of breath.   Cardiovascular:  Negative for chest pain, palpitations and leg swelling.  Gastrointestinal:  Negative for abdominal pain, blood in stool, constipation and diarrhea.  Genitourinary:  Negative for dysuria.  Skin:  Negative for rash.  Neurological:  Negative for dizziness, speech change, seizures, loss of consciousness and headaches.  Psychiatric/Behavioral:  Negative  for depression.       Objective:     BP 120/80 (BP Location: Left Arm, Patient Position: Sitting, Cuff Size: Normal)   Pulse 75   Temp 97.9 F (36.6 C) (Oral)   Ht 5' 11.26" (1.81 m)   Wt 194 lb 6.4 oz (88.2 kg)   SpO2 98%   BMI 26.92 kg/m  BP Readings from Last 3 Encounters:  03/30/23 120/80  02/24/23 120/80  02/17/23 118/64   Wt Readings from Last 3 Encounters:  03/30/23 194 lb 6.4 oz (88.2 kg)  02/24/23 193 lb (87.5 kg)  02/17/23 193 lb 8 oz (87.8 kg)      Physical Exam Vitals reviewed.  Constitutional:      General: Steven Pollard is not in acute distress.    Appearance: Steven Pollard is well-developed. Steven Pollard is not ill-appearing.  HENT:     Head: Normocephalic and atraumatic.     Right Ear: External ear normal.     Left Ear: External ear normal.     Ears:     Comments: Left TM is normal.  Right TM reveals scarring and chronic suppurative changes.    Mouth/Throat:     Mouth: Mucous membranes are moist.     Pharynx: Oropharynx is clear.  Eyes:     Conjunctiva/sclera: Conjunctivae normal.     Pupils: Pupils are equal, round, and reactive to light.  Neck:     Thyroid: No thyromegaly.  Cardiovascular:     Rate and Rhythm: Normal rate and regular rhythm.  Heart sounds: Normal heart sounds. No murmur heard. Pulmonary:     Effort: No respiratory distress.     Breath sounds: No wheezing or rales.  Abdominal:     General: Bowel sounds are normal. There is no distension.     Palpations: Abdomen is soft. There is no mass.     Tenderness: There is no abdominal tenderness. There is no guarding or rebound.  Musculoskeletal:     Cervical back: Normal range of motion and neck supple.     Right lower leg: No edema.     Left lower leg: No edema.  Lymphadenopathy:     Cervical: No cervical adenopathy.  Skin:    Findings: No rash.     Comments: Multiple scattered seborrheic keratoses.  No concerning lesions.  Neurological:     Mental Status: Steven Pollard is alert and oriented to person, place, and  time.     Cranial Nerves: No cranial nerve deficit.      No results found for any visits on 03/30/23.    The ASCVD Risk score (Arnett DK, et al., 2019) failed to calculate for the following reasons:   The valid total cholesterol range is 130 to 320 mg/dL    Assessment & Plan:   Problem List Items Addressed This Visit   None Visit Diagnoses     Physical exam    -  Primary   Relevant Orders   Lipid panel   CBC with Differential/Platelet   CMP   PSA, Medicare   Prostate cancer screening       Relevant Orders   PSA, Medicare     -Recommend annual flu vaccine -Discussed Shingrix which Steven Pollard will consider.  Steven Pollard has had prior Zostavax. -Continue annual low-dose CT lung cancer screening -Check follow-up labs today as above -The natural history of prostate cancer and ongoing controversy regarding screening and potential treatment outcomes of prostate cancer has been discussed with the patient. The meaning of a false positive PSA and a false negative PSA has been discussed. Steven Pollard indicates understanding of the limitations of this screening test and wishes  to proceed with screening PSA testing. -Refill Lovaza for 1 year   No follow-ups on file.    Evelena Peat, MD

## 2023-03-30 NOTE — Patient Instructions (Signed)
Consider Shingrix vaccine and may want to check on insurance coverage first.   

## 2023-04-12 ENCOUNTER — Encounter: Payer: Self-pay | Admitting: Family Medicine

## 2023-04-12 ENCOUNTER — Telehealth (INDEPENDENT_AMBULATORY_CARE_PROVIDER_SITE_OTHER): Payer: Medicare HMO | Admitting: Family Medicine

## 2023-04-12 VITALS — Temp 98.6°F | Ht 71.26 in | Wt 194.4 lb

## 2023-04-12 DIAGNOSIS — U071 COVID-19: Secondary | ICD-10-CM | POA: Diagnosis not present

## 2023-04-12 MED ORDER — NIRMATRELVIR/RITONAVIR (PAXLOVID)TABLET: 3.0000 | ORAL_TABLET | Freq: Two times a day (BID) | ORAL | 0 refills | Status: AC

## 2023-04-12 NOTE — Progress Notes (Signed)
Patient ID: Steven Pollard, male   DOB: 1956-05-25, 67 y.o.   MRN: [id number]   Virtual Visit via Video Note  I connected with Tia Masker on 04/12/23 at  3:00 PM EDT by a video enabled telemedicine application and verified that I am speaking with the correct person using two identifiers.  Location patient: home Location provider:work or home office Persons participating in the virtual visit: patient, provider  I discussed the limitations of evaluation and management by telemedicine and the availability of in person appointments. The patient expressed understanding and agreed to proceed.   HPI: Steven Pollard has COVID from home test earlier today.  He and his wife were at an art show in [place] last Friday and Saturday.  He developed onset of symptoms last night with sore throat and some mild congestion.  He thought this initially may have been some allergies.  COVID test at home positive this morning.  He had some mild bodyaches and flulike symptoms.  Headache but no nausea, vomiting, or diarrhea.  Has had some nasal congestion and mild cough.  No dyspnea.  This is the second time he had COVID.  Previously took antiviral and improved.     ROS: See pertinent positives and negatives per HPI.  Past Medical History:  Diagnosis Date   Allergy 1998   Colon polyps    GERD (gastroesophageal reflux disease)    History of seizure disorder    Hyperlipemia    Prostate disease    Wears glasses     Past Surgical History:  Procedure Laterality Date   BACK SURGERY  2018   BACK SURGERY  01/05/2023   COSMETIC SURGERY  1979   HERNIA REPAIR  1984   left   perforated eardrum  1995   SCAR REVISION  1979   TONSILLECTOMY  1962    Family History  Problem Relation Age of Onset   Arthritis Mother    Heart disease Father 76       heart attack    SOCIAL HX: Ex-smoker.  Quit several years ago.   Current Outpatient Medications:    aspirin 81 MG tablet, Take 1 tablet by mouth daily., Disp: , Rfl:     augmented betamethasone dipropionate (DIPROLENE-AF) 0.05 % ointment, Apply topically 2 (two) times daily., Disp: , Rfl:    celecoxib (CELEBREX) 200 MG capsule, TAKE 1 CAPSULE BY MOUTH EVERY DAY, Disp: 30 capsule, Rfl: 1   cetirizine (ZYRTEC) 10 MG tablet, Take 10 mg by mouth as needed. , Disp: , Rfl:    ciprofloxacin-dexamethasone (CIPRODEX) OTIC suspension, Place 4 drops into the right ear 2 (two) times daily., Disp: 7.5 mL, Rfl: 0   EPINEPHrine (EPIPEN 2-PAK) 0.3 mg/0.3 mL IJ SOAJ injection, Inject 0.3 mLs (0.3 mg total) into the muscle once as needed (difficulty breathing, hypotension, oral swelling)., Disp: 1 Device, Rfl: 0   famotidine (PEPCID) 20 MG tablet, Take 20 mg by mouth as needed for heartburn or indigestion., Disp: , Rfl:    hydrocortisone 2.5 % cream, , Disp: , Rfl:    nirmatrelvir/ritonavir (PAXLOVID) 20 x 150 MG & 10 x 100MG  TABS, Take 3 tablets by mouth 2 (two) times daily for 5 days. (Take nirmatrelvir 150 mg two tablets twice daily for 5 days and ritonavir 100 mg one tablet twice daily for 5 days) Patient GFR is 80, Disp: 30 tablet, Rfl: 0   omega-3 acid ethyl esters (LOVAZA) 1 g capsule, Take 4 capsules by mouth once daily, Disp: 360 capsule, Rfl: 3  rosuvastatin (CRESTOR) 10 MG tablet, TAKE 1 TABLET EVERY DAY, Disp: 30 tablet, Rfl: 0  EXAM:  VITALS per patient if applicable:  GENERAL: alert, oriented, appears well and in no acute distress  HEENT: atraumatic, conjunttiva clear, no obvious abnormalities on inspection of external nose and ears  NECK: normal movements of the head and neck  LUNGS: on inspection no signs of respiratory distress, breathing rate appears normal, no obvious gross SOB, gasping or wheezing  CV: no obvious cyanosis  MS: moves all visible extremities without noticeable abnormality  PSYCH/NEURO: pleasant and cooperative, no obvious depression or anxiety, speech and thought processing grossly intact  ASSESSMENT AND PLAN:  Discussed the  following assessment and plan:  Covid 19 infection.  Patient nontoxic in appearance.  Onset late yesterday of symptoms. -Continue plenty of fluids and rest and Tylenol as needed for headaches and body aches -Discussed antiviral therapies.  We decided to start Paxlovid 3 capsules by mouth twice daily for 5 days.  He had recent GFR of 80. -Hold rosuvastatin while taking the Paxlovid     I discussed the assessment and treatment plan with the patient. The patient was provided an opportunity to ask questions and all were answered. The patient agreed with the plan and demonstrated an understanding of the instructions.   The patient was advised to call back or seek an in-person evaluation if the symptoms worsen or if the condition fails to improve as anticipated.     Evelena Peat, MD

## 2023-04-12 NOTE — Telephone Encounter (Signed)
Noted  

## 2023-04-12 NOTE — Patient Instructions (Signed)
Hold rosuvastatin while taking Paxlovid

## 2023-04-22 ENCOUNTER — Other Ambulatory Visit: Payer: Self-pay

## 2023-04-22 MED ORDER — ROSUVASTATIN CALCIUM 10 MG PO TABS: 10.0000 mg | ORAL_TABLET | Freq: Every day | ORAL | 2 refills | Status: DC

## 2023-07-25 ENCOUNTER — Other Ambulatory Visit: Payer: Self-pay | Admitting: Family Medicine

## 2023-08-03 ENCOUNTER — Ambulatory Visit (INDEPENDENT_AMBULATORY_CARE_PROVIDER_SITE_OTHER): Payer: Medicare HMO | Admitting: Family Medicine

## 2023-08-03 ENCOUNTER — Encounter: Payer: Self-pay | Admitting: Family Medicine

## 2023-08-03 VITALS — BP 130/70 | HR 72 | Temp 98.0°F | Ht 71.0 in | Wt 199.9 lb

## 2023-08-03 DIAGNOSIS — Z23 Encounter for immunization: Secondary | ICD-10-CM

## 2023-08-03 DIAGNOSIS — H66004 Acute suppurative otitis media without spontaneous rupture of ear drum, recurrent, right ear: Secondary | ICD-10-CM

## 2023-08-03 MED ORDER — OFLOXACIN 0.3 % OT SOLN: 5.0000 [drp] | Freq: Two times a day (BID) | OTIC | 0 refills | Status: AC

## 2023-08-03 MED ORDER — AMOXICILLIN-POT CLAVULANATE 875-125 MG PO TABS
1.0000 | ORAL_TABLET | Freq: Two times a day (BID) | ORAL | 0 refills | Status: DC
Start: 2023-08-03 — End: 2024-04-03

## 2023-08-03 NOTE — Progress Notes (Signed)
Established Patient Office Visit  Subjective   Patient ID: Steven Pollard, male    DOB: 1955/10/31  Age: 67 y.o. MRN: [id number]  Chief Complaint  Patient presents with   Ear Pain    Patient complains of right ear pain, x3 days     HPI   Steven Pollard is seen with 3-day history of right ear pain.  Longstanding history of recurrent right ear infections.  He denies any drainage.  No fevers or chills.  No dizziness.  Has used ofloxacin eardrops and Augmentin in the past with seems to work well.  No significant nasal congestion or other respiratory symptoms.  Past Medical History:  Diagnosis Date   Allergy 1998   Colon polyps    GERD (gastroesophageal reflux disease)    History of seizure disorder    Hyperlipemia    Prostate disease    Wears glasses    Past Surgical History:  Procedure Laterality Date   BACK SURGERY  2018   BACK SURGERY  01/05/2023   COSMETIC SURGERY  1979   HERNIA REPAIR  1984   left   perforated eardrum  1995   SCAR REVISION  1979   TONSILLECTOMY  1962    reports that he quit smoking about 16 years ago. His smoking use included cigarettes. He started smoking about 50 years ago. He has a 34 pack-year smoking history. He has never used smokeless tobacco. He reports current alcohol use. He reports that he does not use drugs. family history includes Arthritis in his mother; Heart disease (age of onset: 37) in his father. Allergies  Allergen Reactions   Latex     Unknown    Oxycodone-Acetaminophen     dizzines    Tyloxapol Other (See Comments)    vertigo   Caffeine Rash    Review of Systems  Constitutional:  Negative for chills and fever.  HENT:  Positive for ear pain. Negative for congestion and ear discharge.   Respiratory:  Negative for cough.       Objective:     BP 130/70 (BP Location: Left Arm, Patient Position: Sitting, Cuff Size: Normal)   Pulse 72   Temp 98 F (36.7 C) (Oral)   Ht 5\' 11"  (1.803 m)   Wt 199 lb 14.4 oz (90.7 kg)   SpO2  98%   BMI 27.88 kg/m  BP Readings from Last 3 Encounters:  08/03/23 130/70  03/30/23 120/80  02/24/23 120/80   Wt Readings from Last 3 Encounters:  08/03/23 199 lb 14.4 oz (90.7 kg)  04/12/23 194 lb 6.4 oz (88.2 kg)  03/30/23 194 lb 6.4 oz (88.2 kg)      Physical Exam Vitals reviewed.  Constitutional:      General: He is not in acute distress.    Appearance: Normal appearance.  HENT:     Ears:     Comments: Left eardrum appears normal.  Right ear drum is distorted.  There is moderate erythema especially superior and posterior aspect with some purulent appearing secretions Neurological:     Mental Status: He is alert.      No results found for any visits on 08/03/23.    The ASCVD Risk score (Arnett DK, et al., 2019) failed to calculate for the following reasons:   The valid total cholesterol range is 130 to 320 mg/dL    Assessment & Plan:   Problem List Items Addressed This Visit   None Visit Diagnoses     Recurrent acute suppurative otitis media  of right ear without spontaneous rupture of tympanic membrane    -  Primary   Relevant Medications   amoxicillin-clavulanate (AUGMENTIN) 875-125 MG tablet     -Keep water out of ears much as possible -Ofloxacin eardrops 5 drops right ear twice daily -Augmentin 875 mg twice daily for 10 days -Follow-up for any persistent or worsening symptoms  -Patient requesting flu vaccine and this was given today  No follow-ups on file.    Evelena Peat, MD

## 2023-08-03 NOTE — Addendum Note (Signed)
Addended by: Christy Sartorius on: 08/03/2023 10:13 AM   Modules accepted: Orders

## 2023-09-02 ENCOUNTER — Ambulatory Visit
Admission: RE | Admit: 2023-09-02 | Discharge: 2023-09-02 | Disposition: A | Discharge status: Home / Self Care | Payer: Medicare HMO | Source: Ambulatory Visit | Attending: Family Medicine | Admitting: Family Medicine

## 2023-09-02 DIAGNOSIS — Z87891 Personal history of nicotine dependence: Secondary | ICD-10-CM

## 2023-09-02 DIAGNOSIS — Z122 Encounter for screening for malignant neoplasm of respiratory organs: Secondary | ICD-10-CM

## 2023-09-02 DIAGNOSIS — J439 Emphysema, unspecified: Secondary | ICD-10-CM | POA: Diagnosis not present

## 2023-09-02 DIAGNOSIS — I700 Atherosclerosis of aorta: Secondary | ICD-10-CM | POA: Diagnosis not present

## 2023-09-24 ENCOUNTER — Other Ambulatory Visit: Payer: Self-pay

## 2023-09-29 DIAGNOSIS — G43909 Migraine, unspecified, not intractable, without status migrainosus: Secondary | ICD-10-CM | POA: Diagnosis not present

## 2023-09-29 DIAGNOSIS — H2513 Age-related nuclear cataract, bilateral: Secondary | ICD-10-CM | POA: Diagnosis not present

## 2023-09-29 DIAGNOSIS — H4322 Crystalline deposits in vitreous body, left eye: Secondary | ICD-10-CM | POA: Diagnosis not present

## 2023-09-29 DIAGNOSIS — H5203 Hypermetropia, bilateral: Secondary | ICD-10-CM | POA: Diagnosis not present

## 2023-09-29 DIAGNOSIS — H43812 Vitreous degeneration, left eye: Secondary | ICD-10-CM | POA: Diagnosis not present

## 2023-10-05 DIAGNOSIS — Z0100 Encounter for examination of eyes and vision without abnormal findings: Secondary | ICD-10-CM | POA: Diagnosis not present

## 2023-11-02 DIAGNOSIS — L308 Other specified dermatitis: Secondary | ICD-10-CM | POA: Diagnosis not present

## 2023-11-02 DIAGNOSIS — D225 Melanocytic nevi of trunk: Secondary | ICD-10-CM | POA: Diagnosis not present

## 2023-11-02 DIAGNOSIS — L812 Freckles: Secondary | ICD-10-CM | POA: Diagnosis not present

## 2023-11-02 DIAGNOSIS — L821 Other seborrheic keratosis: Secondary | ICD-10-CM | POA: Diagnosis not present

## 2023-11-29 ENCOUNTER — Other Ambulatory Visit: Payer: Self-pay | Admitting: Family Medicine

## 2024-02-17 ENCOUNTER — Encounter: Payer: Self-pay | Admitting: Family Medicine

## 2024-02-25 ENCOUNTER — Ambulatory Visit: Payer: Medicare HMO

## 2024-02-25 VITALS — BP 120/62 | HR 60 | Temp 97.8°F | Ht 71.0 in | Wt 196.2 lb

## 2024-02-25 DIAGNOSIS — Z0000 Encounter for general adult medical examination without abnormal findings: Secondary | ICD-10-CM | POA: Diagnosis not present

## 2024-02-25 NOTE — Progress Notes (Addendum)
 Subjective:   Steven Pollard is a 68 y.o. who presents for a Medicare Wellness preventive visit.  As a reminder, Annual Wellness Visits don't include a physical exam, and some assessments may be limited, especially if this visit is performed virtually. We may recommend an in-person follow-up visit with your provider if needed.  Visit Complete: In person    Persons Participating in Visit: Patient.  AWV Questionnaire: Yes: Patient Medicare AWV questionnaire was completed by the patient on 02/21/24; I have confirmed that all information answered by patient is correct and no changes since this date.  Cardiac Risk Factors include: advanced age (>17men, >33 women);male gender     Objective:     Today's Vitals   02/25/24 0917  BP: 120/62  Pulse: 60  Temp: 97.8 F (36.6 C)  TempSrc: Oral  SpO2: 94%  Weight: 196 lb 3.2 oz (89 kg)  Height: 5\' 11"  (1.803 m)   Body mass index is 27.36 kg/m.     02/25/2024    9:33 AM 02/17/2023    9:45 AM 02/03/2022    8:40 AM 01/28/2021    8:16 AM  Advanced Directives  Does Patient Have a Medical Advance Directive? Yes Yes Yes Yes  Type of Estate agent of [place];Living will Healthcare Power of [place];Living will Healthcare Power of [place];Living will Healthcare Power of Attorney  Copy of Healthcare Power of Attorney in Chart? No - copy requested No - copy requested No - copy requested     Current Medications (verified) Outpatient Encounter Medications as of 02/25/2024  Medication Sig   amoxicillin -clavulanate (AUGMENTIN ) 875-125 MG tablet Take 1 tablet by mouth 2 (two) times daily. (Patient not taking: Reported on 02/25/2024)   aspirin 81 MG tablet Take 1 tablet by mouth daily. (Patient not taking: Reported on 02/25/2024)   augmented betamethasone dipropionate (DIPROLENE-AF) 0.05 % ointment Apply topically 2 (two) times daily.   celecoxib  (CELEBREX ) 200 MG capsule TAKE 1 CAPSULE BY MOUTH EVERY DAY (Patient not taking:  Reported on 02/25/2024)   cetirizine (ZYRTEC) 10 MG tablet Take 10 mg by mouth as needed.    ciprofloxacin -dexamethasone  (CIPRODEX ) OTIC suspension Place 4 drops into the right ear 2 (two) times daily. (Patient not taking: Reported on 02/25/2024)   EPINEPHrine  (EPIPEN  2-PAK) 0.3 mg/0.3 mL IJ SOAJ injection Inject 0.3 mLs (0.3 mg total) into the muscle once as needed (difficulty breathing, hypotension, oral swelling). (Patient not taking: Reported on 02/25/2024)   famotidine  (PEPCID ) 20 MG tablet Take 20 mg by mouth as needed for heartburn or indigestion.   hydrocortisone 2.5 % cream    ofloxacin  (FLOXIN ) 0.3 % OTIC solution Place 5 drops into the right ear 2 (two) times daily.   omega-3 acid ethyl esters (LOVAZA ) 1 g capsule Take 4 capsules by mouth once daily   rosuvastatin  (CRESTOR ) 10 MG tablet TAKE 1 TABLET (10 MG TOTAL) BY MOUTH DAILY.   No facility-administered encounter medications on file as of 02/25/2024.    Allergies (verified) Latex, Oxycodone -acetaminophen , Tyloxapol, and Caffeine   History: Past Medical History:  Diagnosis Date   Allergy 1998   Colon polyps    GERD (gastroesophageal reflux disease)    History of seizure disorder    Hyperlipemia    Prostate disease    Wears glasses    Past Surgical History:  Procedure Laterality Date   BACK SURGERY  2018   BACK SURGERY  01/05/2023   COSMETIC SURGERY  1979   HERNIA REPAIR  1984   left  perforated eardrum  1995   SCAR REVISION  1979   TONSILLECTOMY  1962   Family History  Problem Relation Age of Onset   Arthritis Mother    Heart disease Father 19       heart attack   Social History   Socioeconomic History   Marital status: Married    Spouse name: Not on file   Number of children: Not on file   Years of education: Not on file   Highest education level: Associate degree: occupational, Scientist, product/process development, or vocational program  Occupational History   Not on file  Tobacco Use   Smoking status: Former    Current  packs/day: 0.00    Average packs/day: 1 pack/day for 34.0 years (34.0 ttl pk-yrs)    Types: Cigarettes    Start date: 10/12/1972    Quit date: 10/12/2006    Years since quitting: 17.3   Smokeless tobacco: Never   Tobacco comments:    Encouraged to remain smoke free  Vaping Use   Vaping status: Never Used  Substance and Sexual Activity   Alcohol use: Yes    Comment: socially   Drug use: No   Sexual activity: Yes  Other Topics Concern   Not on file  Social History Narrative   Not on file   Social Drivers of Health   Financial Resource Strain: Low Risk  (02/25/2024)   Overall Financial Resource Strain (CARDIA)    Difficulty of Paying Living Expenses: Not hard at all  Food Insecurity: No Food Insecurity (02/25/2024)   Hunger Vital Sign    Worried About Running Out of Food in the Last Year: Never true    Ran Out of Food in the Last Year: Never true  Transportation Needs: No Transportation Needs (02/25/2024)   PRAPARE - Administrator, Civil Service (Medical): No    Lack of Transportation (Non-Medical): No  Physical Activity: Insufficiently Active (02/25/2024)   Exercise Vital Sign    Days of Exercise per Week: 7 days    Minutes of Exercise per Session: 20 min  Stress: No Stress Concern Present (02/25/2024)   [employer] of Occupational Health - Occupational Stress Questionnaire    Feeling of Stress : Not at all  Social Connections: Socially Integrated (02/25/2024)   Social Connection and Isolation Panel [NHANES]    Frequency of Communication with Friends and Family: More than three times a week    Frequency of Social Gatherings with Friends and Family: More than three times a week    Attends Religious Services: More than 4 times per year    Active Member of [employer] or Organizations: Yes    Attends Engineer, structural: More than 4 times per year    Marital Status: Married    Tobacco Counseling Counseling given: Not Answered Tobacco comments: Encouraged to  remain smoke free    Clinical Intake:  Pre-visit preparation completed: Yes  Pain : No/denies pain     BMI - recorded: 27.36 Nutritional Status: BMI 25 -29 Overweight Nutritional Risks: None Diabetes: No  Lab Results  Component Value Date   HGBA1C 5.5 05/13/2016     How often do you need to have someone help you when you read instructions, pamphlets, or other written materials from your doctor or pharmacy?: 1 - Never  Interpreter Needed?: No  Information entered by :: Farris Hong LPN   Activities of Daily Living     02/25/2024    9:32 AM 02/21/2024   10:00 AM  In your present state of health, do you have any difficulty performing the following activities:  Hearing? 0 0  Vision? 0 0  Difficulty concentrating or making decisions? 0 0  Walking or climbing stairs? 0 0  Dressing or bathing? 0 0  Doing errands, shopping? 0 0  Preparing Food and eating ? N N  Using the Toilet? N N  In the past six months, have you accidently leaked urine? N N  Do you have problems with loss of bowel control? N N  Managing your Medications? N N  Managing your Finances? N N  Housekeeping or managing your Housekeeping? N N    Patient Care Team: Marquetta Sit, MD as PCP - General (Family Medicine)  Indicate any recent Medical Services you may have received from other than [facility] providers in the past year (date may be approximate).     Assessment:    This is a routine wellness examination for [place].  Hearing/Vision screen Hearing Screening - Comments:: Denies hearing difficulties   Vision Screening - Comments:: Wears rx glasses - up to date with routine eye exams with  Dr Joanne Muckle   Goals Addressed               This Visit's Progress     Remain active (pt-stated)        Lose a few pounds.       Depression Screen     02/25/2024    9:19 AM 02/17/2023    9:28 AM 08/17/2022    9:16 AM 02/03/2022    8:42 AM 10/31/2021    8:30 AM 01/28/2021    8:14 AM 10/25/2019     7:45 AM  PHQ 2/9 Scores  PHQ - 2 Score 0 0 0 0 0 1 0  PHQ- 9 Score       0    Fall Risk     02/25/2024    9:32 AM 02/21/2024   10:00 AM 02/17/2023    9:43 AM 08/14/2022    2:25 PM 02/03/2022    8:41 AM  Fall Risk   Falls in the past year? 0 0 0 0 0  Number falls in past yr: 0 0 0  0  Injury with Fall? 0 0 0  0  Risk for fall due to : No Fall Risks  No Fall Risks  Medication side effect  Follow up Falls prevention discussed;Falls evaluation completed  Falls prevention discussed  Falls evaluation completed;Education provided;Falls prevention discussed    MEDICARE RISK AT HOME:  Medicare Risk at Home Any stairs in or around the home?: Yes If so, are there any without handrails?: No Home free of loose throw rugs in walkways, pet beds, electrical cords, etc?: Yes Adequate lighting in your home to reduce risk of falls?: Yes Life alert?: No Use of a cane, walker or w/c?: No Grab bars in the bathroom?: No Shower chair or bench in shower?: No Elevated toilet seat or a handicapped toilet?: No  TIMED UP AND GO:  Was the test performed?  Yes  Gait fast steady fast without use of device   Cognitive Function: 6CIT completed        02/25/2024    9:33 AM 02/17/2023    9:45 AM 02/03/2022    8:44 AM 01/28/2021    8:20 AM  6CIT Screen  What Year? 0 points 0 points 0 points 0 points  What month? 0 points 0 points 0 points 0 points  What time? 0 points 0  points 0 points   Count back from 20 0 points 0 points 0 points 0 points  Months in reverse 0 points 0 points 0 points 2 points  Repeat phrase 0 points 0 points 0 points 0 points  Total Score 0 points 0 points 0 points     Immunizations Immunization History  Administered Date(s) Administered   Fluad Quad(high Dose 65+) 08/29/2019, 08/04/2021, 08/17/2022   Fluad Trivalent(High Dose 65+) 08/03/2023   Influenza Split 10/12/2006, 07/12/2010   Influenza,inj,Quad PF,6+ Mos 09/26/2014, 10/02/2015, 07/10/2016, 10/22/2017, 10/25/2018    PFIZER(Purple Top)SARS-COV-2 Vaccination 09/27/2020   Pfizer Covid-19 Vaccine Bivalent Booster 72yrs & up 08/12/2021   Pneumococcal Conjugate-13 10/25/2019   Pneumococcal Polysaccharide-23 10/31/2021   Tdap 07/30/2010   Zoster, Live 10/02/2015    Screening Tests Health Maintenance  Topic Date Due   Zoster Vaccines- Shingrix (1 of 2) 12/13/2003   DTaP/Tdap/Td (2 - Td or Tdap) 07/30/2020   COVID-19 Vaccine (3 - 2024-25 season) 06/13/2023   INFLUENZA VACCINE  05/12/2024   Medicare Annual Wellness (AWV)  02/24/2025   Colonoscopy  04/16/2032   Pneumonia Vaccine 33+ Years old  Completed   Hepatitis C Screening  Completed   HPV VACCINES  Aged Out   Meningococcal B Vaccine  Aged Out    Health Maintenance  Health Maintenance Due  Topic Date Due   Zoster Vaccines- Shingrix (1 of 2) 12/13/2003   DTaP/Tdap/Td (2 - Td or Tdap) 07/30/2020   COVID-19 Vaccine (3 - 2024-25 season) 06/13/2023   Health Maintenance Items Addressed: Vaccines deferred  Additional Screening:  Vision Screening: Recommended annual ophthalmology exams for early detection of glaucoma and other disorders of the eye.  Dental Screening: Recommended annual dental exams for proper oral hygiene  Community Resource Referral / Chronic Care Management: CRR required this visit?  No   CCM required this visit?  No   Plan:    I have personally reviewed and noted the following in the patient's chart:   Medical and social history Use of alcohol, tobacco or illicit drugs  Current medications and supplements including opioid prescriptions. Patient is not currently taking opioid prescriptions. Functional ability and status Nutritional status Physical activity Advanced directives List of other physicians Hospitalizations, surgeries, and ER visits in previous 12 months Vitals Screenings to include cognitive, depression, and falls Referrals and appointments  In addition, I have reviewed and discussed with patient  certain preventive protocols, quality metrics, and best practice recommendations. A written personalized care plan for preventive services as well as general preventive health recommendations were provided to patient.   Dewayne Ford, LPN   01/18/8118   After Visit Summary: (In Person-Printed) AVS printed and given to the patient  Notes: Nothing significant to report at this time.

## 2024-02-25 NOTE — Patient Instructions (Addendum)
 Mr. Bredow , Thank you for taking time out of your busy schedule to complete your Annual Wellness Visit with me. I enjoyed our conversation and look forward to speaking with you again next year. I, as well as your care team,  appreciate your ongoing commitment to your health goals. Please review the following plan we discussed and let me know if I can assist you in the future. Your Game plan/ To Do List    Referrals: If you haven't heard from the office you've been referred to, please reach out to them at the phone provided.   Follow up Visits: Next Medicare AWV with our clinical staff: 12/31/24 @ 9:20a   Have you seen your provider in the last 6 months (3 months if uncontrolled diabetes)?  Next Office Visit with your provider: 04/03/24 @ 8:15a  Clinician Recommendations:  Aim for 30 minutes of exercise or brisk walking, 6-8 glasses of water, and 5 servings of fruits and vegetables each day.       This is a list of the screening recommended for you and due dates:  Health Maintenance  Topic Date Due   Zoster (Shingles) Vaccine (1 of 2) 12/13/2003   DTaP/Tdap/Td vaccine (2 - Td or Tdap) 07/30/2020   COVID-19 Vaccine (3 - 2024-25 season) 06/13/2023   Flu Shot  05/12/2024   Medicare Annual Wellness Visit  02/24/2025   Colon Cancer Screening  04/16/2032   Pneumonia Vaccine  Completed   Hepatitis C Screening  Completed   HPV Vaccine  Aged Out   Meningitis B Vaccine  Aged Out    Advanced directives: (Copy Requested) Please bring a copy of your health care power of attorney and living will to the office to be added to your chart at your convenience. You can mail to [facility] [address] [place], [place] [address] or email to [email or web address] Advance Care Planning is important because it:  [x]  Makes sure you receive the medical care that is consistent with your values, goals, and preferences  [x]  It provides guidance to your family and loved ones and reduces  their decisional burden about whether or not they are making the right decisions based on your wishes.  Follow the link provided in your after visit summary or read over the paperwork we have mailed to you to help you started getting your Advance Directives in place. If you need assistance in completing these, please reach out to us  so that we can help you!  See attachments for Preventive Care and Fall Prevention Tips.

## 2024-04-03 ENCOUNTER — Ambulatory Visit: Payer: Self-pay | Admitting: Family Medicine

## 2024-04-03 ENCOUNTER — Encounter: Payer: Self-pay | Admitting: Family Medicine

## 2024-04-03 ENCOUNTER — Ambulatory Visit (INDEPENDENT_AMBULATORY_CARE_PROVIDER_SITE_OTHER): Admitting: Family Medicine

## 2024-04-03 VITALS — BP 130/68 | HR 58 | Temp 97.6°F | Ht 71.26 in | Wt 195.6 lb

## 2024-04-03 DIAGNOSIS — Z125 Encounter for screening for malignant neoplasm of prostate: Secondary | ICD-10-CM

## 2024-04-03 DIAGNOSIS — E785 Hyperlipidemia, unspecified: Secondary | ICD-10-CM

## 2024-04-03 DIAGNOSIS — Z0000 Encounter for general adult medical examination without abnormal findings: Secondary | ICD-10-CM | POA: Diagnosis not present

## 2024-04-03 LAB — CBC WITH DIFFERENTIAL/PLATELET
Basophils Absolute: 0 10*3/uL (ref 0.0–0.1)
Basophils Relative: 0.5 % (ref 0.0–3.0)
Eosinophils Absolute: 0 10*3/uL (ref 0.0–0.7)
Eosinophils Relative: 0 % (ref 0.0–5.0)
HCT: 46.5 % (ref 39.0–52.0)
Hemoglobin: 15.7 g/dL (ref 13.0–17.0)
Lymphocytes Relative: 35.7 % (ref 12.0–46.0)
Lymphs Abs: 1.7 10*3/uL (ref 0.7–4.0)
MCHC: 33.8 g/dL (ref 30.0–36.0)
MCV: 95.4 fl (ref 78.0–100.0)
Monocytes Absolute: 0.5 10*3/uL (ref 0.1–1.0)
Monocytes Relative: 10.8 % (ref 3.0–12.0)
Neutro Abs: 2.6 10*3/uL (ref 1.4–7.7)
Neutrophils Relative %: 53 % (ref 43.0–77.0)
Platelets: 165 10*3/uL (ref 150.0–400.0)
RBC: 4.87 Mil/uL (ref 4.22–5.81)
RDW: 13.1 % (ref 11.5–15.5)
WBC: 4.9 10*3/uL (ref 4.0–10.5)

## 2024-04-03 LAB — COMPREHENSIVE METABOLIC PANEL WITH GFR
ALT: 22 U/L (ref 0–53)
AST: 19 U/L (ref 0–37)
Albumin: 4.6 g/dL (ref 3.5–5.2)
Alkaline Phosphatase: 65 U/L (ref 39–117)
BUN: 8 mg/dL (ref 6–23)
CO2: 32 meq/L (ref 19–32)
Calcium: 9.6 mg/dL (ref 8.4–10.5)
Chloride: 102 meq/L (ref 96–112)
Creatinine, Ser: 0.88 mg/dL (ref 0.40–1.50)
GFR: 87.25 mL/min (ref >60.00)
Glucose, Bld: 103 mg/dL — ABNORMAL HIGH (ref 70–99)
Potassium: 4 meq/L (ref 3.5–5.1)
Sodium: 141 meq/L (ref 135–145)
Total Bilirubin: 1.3 mg/dL — ABNORMAL HIGH (ref 0.2–1.2)
Total Protein: 6.8 g/dL (ref 6.0–8.3)

## 2024-04-03 LAB — PSA, MEDICARE: PSA: 2.56 ng/mL (ref 0.10–4.00)

## 2024-04-03 LAB — LIPID PANEL
Cholesterol: 137 mg/dL (ref 0–200)
HDL: 38.8 mg/dL — ABNORMAL LOW (ref >39.00)
LDL Cholesterol: 81 mg/dL (ref 0–99)
NonHDL: 98.3
Total CHOL/HDL Ratio: 4
Triglycerides: 85 mg/dL (ref 0.0–149.0)
VLDL: 17 mg/dL (ref 0.0–40.0)

## 2024-04-03 MED ORDER — ROSUVASTATIN CALCIUM 10 MG PO TABS: 10.0000 mg | ORAL_TABLET | Freq: Every day | ORAL | 3 refills | Status: AC

## 2024-04-03 MED ORDER — OMEGA-3-ACID ETHYL ESTERS 1 G PO CAPS: ORAL_CAPSULE | ORAL | 3 refills | Status: AC

## 2024-04-03 NOTE — Patient Instructions (Signed)
 Consider tetanus and Shingrix vaccines at some point this year.

## 2024-04-03 NOTE — Progress Notes (Signed)
 Established Patient Office Visit  Subjective   Patient ID: LORENSO Pollard, male    DOB: 11/05/1955  Age: 68 y.o. MRN: [id number]  Chief Complaint  Patient presents with   Annual Exam    HPI   [name] is seen today for exam.  Generally doing well.  He and his wife just moved to new house not too far from where they previously lived for 38 years.  Past medical history reviewed.  He has history of GERD, dyslipidemia, history of kidney stones.  Quit smoking over 15 years ago.  Has participated in low-dose CT lung cancer screening in the past but apparently this year ages out of that program.  He remains on Crestor  and Lovaza  for his hyperlipidemia.  Due for follow-up labs.  Health maintenance reviewed:  Health Maintenance  Topic Date Due   Zoster Vaccines- Shingrix (1 of 2) 12/13/2003   DTaP/Tdap/Td (2 - Td or Tdap) 07/30/2020   COVID-19 Vaccine (3 - 2024-25 season) 06/13/2023   INFLUENZA VACCINE  05/12/2024   Medicare Annual Wellness (AWV)  02/24/2025   Colonoscopy  04/16/2032   Pneumococcal Vaccine: 50+ Years  Completed   Hepatitis C Screening  Completed   HPV VACCINES  Aged Out   Meningococcal B Vaccine  Aged Out   - Does need Shingrix and tetanus booster.  He plans to consider getting these at pharmacy later this year  Social history-retired from [employer] several years ago.  He is married.  He has 2 daughters.  Quit smoking 2007/05/05.  Family history-father died around age 62 sudden death of uncertain etiology.  His mother died age 35 of uncertain etiology.  She had severe arthritis changes especially in her back with multiple back surgeries.  He has a sister but not aware of her health issues.  He is not aware of any family history of type 2 diabetes.  Past Medical History:  Diagnosis Date   Allergy May 04, 1997   Colon polyps    GERD (gastroesophageal reflux disease)    History of seizure disorder    Hyperlipemia    Prostate disease    Wears glasses    Past Surgical  History:  Procedure Laterality Date   BACK SURGERY  05/04/17   BACK SURGERY  01/05/2023   COSMETIC SURGERY  1979   HERNIA REPAIR  1984   left   perforated eardrum  1995   SCAR REVISION  1979   TONSILLECTOMY  1962    reports that he quit smoking about 17 years ago. His smoking use included cigarettes. He started smoking about 51 years ago. He has a 34 pack-year smoking history. He has never used smokeless tobacco. He reports current alcohol use. He reports that he does not use drugs. family history includes Arthritis in his mother; Heart disease (age of onset: 64) in his father. Allergies  Allergen Reactions   Latex     Unknown    Oxycodone -Acetaminophen      dizzines    Tyloxapol Other (See Comments)    vertigo   Caffeine Rash    Review of Systems  Constitutional:  Negative for chills, fever, malaise/fatigue and weight loss.  HENT:  Negative for hearing loss.   Eyes:  Negative for blurred vision and double vision.  Respiratory:  Negative for cough and shortness of breath.   Cardiovascular:  Negative for chest pain, palpitations and leg swelling.  Gastrointestinal:  Negative for abdominal pain, blood in stool, constipation and diarrhea.  Genitourinary:  Negative for dysuria.  Skin:  Negative for rash.  Neurological:  Negative for dizziness, speech change, seizures, loss of consciousness and headaches.  Psychiatric/Behavioral:  Negative for depression.       Objective:     BP 130/68 (BP Location: Left Arm, Patient Position: Sitting, Cuff Size: Normal)   Pulse (!) 58   Temp 97.6 F (36.4 C) (Oral)   Ht 5' 11.26 (1.81 m)   Wt 195 lb 9.6 oz (88.7 kg)   SpO2 97%   BMI 27.08 kg/m  BP Readings from Last 3 Encounters:  04/03/24 130/68  02/25/24 120/62  08/03/23 130/70   Wt Readings from Last 3 Encounters:  04/03/24 195 lb 9.6 oz (88.7 kg)  02/25/24 196 lb 3.2 oz (89 kg)  08/03/23 199 lb 14.4 oz (90.7 kg)      Physical Exam Vitals reviewed.  Constitutional:       General: He is not in acute distress.    Appearance: He is well-developed.  HENT:     Head: Normocephalic and atraumatic.     Ears:     Comments: He has chronic suppurative changes and distortion of landmarks right TM  Eyes:     Conjunctiva/sclera: Conjunctivae normal.     Pupils: Pupils are equal, round, and reactive to light.   Neck:     Thyroid : No thyromegaly.   Cardiovascular:     Rate and Rhythm: Normal rate and regular rhythm.     Heart sounds: Normal heart sounds. No murmur heard. Pulmonary:     Effort: No respiratory distress.     Breath sounds: No wheezing or rales.  Abdominal:     General: Bowel sounds are normal. There is no distension.     Palpations: Abdomen is soft. There is no mass.     Tenderness: There is no abdominal tenderness. There is no guarding or rebound.   Musculoskeletal:     Cervical back: Normal range of motion and neck supple.     Right lower leg: No edema.     Left lower leg: No edema.  Lymphadenopathy:     Cervical: No cervical adenopathy.   Skin:    Findings: No rash.   Neurological:     Mental Status: He is alert and oriented to person, place, and time.     Cranial Nerves: No cranial nerve deficit.      No results found for any visits on 04/03/24.    The ASCVD Risk score (Arnett DK, et al., 2019) failed to calculate for the following reasons:   The valid total cholesterol range is 130 to 320 mg/dL    Assessment & Plan:   Problem List Items Addressed This Visit   None Visit Diagnoses       Physical exam    -  Primary   Relevant Orders   CBC with Differential/Platelet     Prostate cancer screening       Relevant Orders   PSA, Medicare     Hyperlipidemia, unspecified hyperlipidemia type       Relevant Medications   rosuvastatin  (CRESTOR ) 10 MG tablet   omega-3 acid ethyl esters (LOVAZA ) 1 g capsule   Other Relevant Orders   Lipid panel   CMP     Here for physical exam.  He has hyperlipidemia treated with Lovaza  and  rosuvastatin .  Refilled both medications for 1 year.  Check labs as above.  Discussed pros and cons of PSA screening and he would like to continue.  We have strongly advised tetanus and  Shingrix vaccines at some point this year.  He will move out of low-dose CT lung cancer screening this year-secondary to over 15 years since quitting smoking  No follow-ups on file.    Wolm Scarlet, MD

## 2025-03-02 ENCOUNTER — Ambulatory Visit
